# Patient Record
Sex: Female | Born: 1950
Health system: Southern US, Community
[De-identification: ages and names within clinical notes are randomized; demographics above are authoritative.]

## PROBLEM LIST (undated history)

## (undated) DIAGNOSIS — F32A Depression, unspecified: Secondary | ICD-10-CM

## (undated) DIAGNOSIS — E559 Vitamin D deficiency, unspecified: Secondary | ICD-10-CM

## (undated) DIAGNOSIS — C541 Malignant neoplasm of endometrium: Secondary | ICD-10-CM

## (undated) DIAGNOSIS — K219 Gastro-esophageal reflux disease without esophagitis: Secondary | ICD-10-CM

## (undated) DIAGNOSIS — M1990 Unspecified osteoarthritis, unspecified site: Secondary | ICD-10-CM

## (undated) DIAGNOSIS — E079 Disorder of thyroid, unspecified: Secondary | ICD-10-CM

## (undated) DIAGNOSIS — F419 Anxiety disorder, unspecified: Secondary | ICD-10-CM

## (undated) DIAGNOSIS — M810 Age-related osteoporosis without current pathological fracture: Secondary | ICD-10-CM

## (undated) DIAGNOSIS — F329 Major depressive disorder, single episode, unspecified: Secondary | ICD-10-CM

## (undated) DIAGNOSIS — D649 Anemia, unspecified: Secondary | ICD-10-CM

## (undated) DIAGNOSIS — R569 Unspecified convulsions: Secondary | ICD-10-CM

## (undated) DIAGNOSIS — N952 Postmenopausal atrophic vaginitis: Secondary | ICD-10-CM

## (undated) DIAGNOSIS — N809 Endometriosis, unspecified: Secondary | ICD-10-CM

## (undated) DIAGNOSIS — T7840XA Allergy, unspecified, initial encounter: Secondary | ICD-10-CM

## (undated) DIAGNOSIS — E7800 Pure hypercholesterolemia, unspecified: Secondary | ICD-10-CM

## (undated) HISTORY — DX: Unspecified convulsions: R56.9

## (undated) HISTORY — DX: Malignant neoplasm of endometrium: C54.1

## (undated) HISTORY — DX: Disorder of thyroid, unspecified: E07.9

## (undated) HISTORY — DX: Major depressive disorder, single episode, unspecified: F32.9

## (undated) HISTORY — DX: Gastro-esophageal reflux disease without esophagitis: K21.9

## (undated) HISTORY — DX: Depression, unspecified: F32.A

## (undated) HISTORY — DX: Unspecified osteoarthritis, unspecified site: M19.90

## (undated) HISTORY — PX: OTHER SURGICAL HISTORY: SHX169

## (undated) HISTORY — DX: Age-related osteoporosis without current pathological fracture: M81.0

## (undated) HISTORY — DX: Postmenopausal atrophic vaginitis: N95.2

## (undated) HISTORY — DX: Anxiety disorder, unspecified: F41.9

## (undated) HISTORY — PX: OOPHORECTOMY: SHX86

## (undated) HISTORY — PX: BUNIONECTOMY: SHX129

## (undated) HISTORY — DX: Vitamin D deficiency, unspecified: E55.9

## (undated) HISTORY — PX: PELVIC LAPAROSCOPY: SHX162

## (undated) HISTORY — DX: Pure hypercholesterolemia, unspecified: E78.00

## (undated) HISTORY — PX: COLONOSCOPY: SHX174

## (undated) HISTORY — DX: Endometriosis, unspecified: N80.9

## (undated) HISTORY — PX: TONSILLECTOMY: SUR1361

## (undated) HISTORY — DX: Anemia, unspecified: D64.9

## (undated) HISTORY — DX: Allergy, unspecified, initial encounter: T78.40XA

---

## 1997-05-04 ENCOUNTER — Encounter: Admission: RE | Admit: 1997-05-04 | Discharge: 1997-08-02 | Payer: Self-pay | Admitting: Internal Medicine

## 1998-03-02 ENCOUNTER — Other Ambulatory Visit: Admission: RE | Admit: 1998-03-02 | Discharge: 1998-03-02 | Payer: Self-pay | Admitting: Obstetrics and Gynecology

## 1998-07-30 ENCOUNTER — Encounter: Payer: Self-pay | Admitting: Urology

## 1998-07-30 ENCOUNTER — Ambulatory Visit (HOSPITAL_COMMUNITY): Admission: RE | Admit: 1998-07-30 | Discharge: 1998-07-30 | Payer: Self-pay | Admitting: Urology

## 1998-08-08 ENCOUNTER — Ambulatory Visit (HOSPITAL_COMMUNITY): Admission: RE | Admit: 1998-08-08 | Discharge: 1998-08-08 | Payer: Self-pay | Admitting: Urology

## 1998-08-08 ENCOUNTER — Encounter: Payer: Self-pay | Admitting: Urology

## 1999-02-07 ENCOUNTER — Encounter: Admission: RE | Admit: 1999-02-07 | Discharge: 1999-02-07 | Payer: Self-pay | Admitting: Obstetrics and Gynecology

## 1999-02-07 ENCOUNTER — Encounter: Payer: Self-pay | Admitting: Obstetrics and Gynecology

## 1999-02-14 ENCOUNTER — Encounter: Payer: Self-pay | Admitting: Obstetrics and Gynecology

## 1999-02-14 ENCOUNTER — Encounter: Admission: RE | Admit: 1999-02-14 | Discharge: 1999-02-14 | Payer: Self-pay | Admitting: Obstetrics and Gynecology

## 2000-02-27 ENCOUNTER — Encounter: Admission: RE | Admit: 2000-02-27 | Discharge: 2000-02-27 | Payer: Self-pay | Admitting: Obstetrics and Gynecology

## 2000-02-27 ENCOUNTER — Encounter: Payer: Self-pay | Admitting: Obstetrics and Gynecology

## 2000-08-31 ENCOUNTER — Other Ambulatory Visit: Admission: RE | Admit: 2000-08-31 | Discharge: 2000-08-31 | Payer: Self-pay | Admitting: Obstetrics and Gynecology

## 2001-05-07 ENCOUNTER — Encounter: Admission: RE | Admit: 2001-05-07 | Discharge: 2001-05-07 | Payer: Self-pay | Admitting: Obstetrics and Gynecology

## 2001-05-07 ENCOUNTER — Encounter: Payer: Self-pay | Admitting: Obstetrics and Gynecology

## 2001-08-31 ENCOUNTER — Other Ambulatory Visit: Admission: RE | Admit: 2001-08-31 | Discharge: 2001-08-31 | Payer: Self-pay | Admitting: Obstetrics and Gynecology

## 2002-01-28 ENCOUNTER — Ambulatory Visit (HOSPITAL_COMMUNITY): Admission: RE | Admit: 2002-01-28 | Discharge: 2002-01-28 | Payer: Self-pay | Admitting: Gastroenterology

## 2002-08-04 ENCOUNTER — Encounter: Admission: RE | Admit: 2002-08-04 | Discharge: 2002-08-04 | Payer: Self-pay | Admitting: Obstetrics and Gynecology

## 2002-08-04 ENCOUNTER — Encounter: Payer: Self-pay | Admitting: Obstetrics and Gynecology

## 2002-10-04 ENCOUNTER — Other Ambulatory Visit: Admission: RE | Admit: 2002-10-04 | Discharge: 2002-10-04 | Payer: Self-pay | Admitting: Obstetrics and Gynecology

## 2003-02-04 HISTORY — PX: ABDOMINAL HYSTERECTOMY: SHX81

## 2003-10-11 ENCOUNTER — Encounter: Admission: RE | Admit: 2003-10-11 | Discharge: 2003-10-11 | Payer: Self-pay | Admitting: Obstetrics and Gynecology

## 2003-10-11 ENCOUNTER — Other Ambulatory Visit: Admission: RE | Admit: 2003-10-11 | Discharge: 2003-10-11 | Payer: Self-pay | Admitting: Obstetrics and Gynecology

## 2003-11-06 ENCOUNTER — Encounter (INDEPENDENT_AMBULATORY_CARE_PROVIDER_SITE_OTHER): Payer: Self-pay | Admitting: Specialist

## 2003-11-06 ENCOUNTER — Encounter (INDEPENDENT_AMBULATORY_CARE_PROVIDER_SITE_OTHER): Payer: Self-pay | Admitting: *Deleted

## 2003-11-06 ENCOUNTER — Inpatient Hospital Stay (HOSPITAL_COMMUNITY): Admission: RE | Admit: 2003-11-06 | Discharge: 2003-11-09 | Payer: Self-pay | Admitting: Obstetrics and Gynecology

## 2004-04-01 ENCOUNTER — Other Ambulatory Visit: Admission: RE | Admit: 2004-04-01 | Discharge: 2004-04-01 | Payer: Self-pay | Admitting: Obstetrics and Gynecology

## 2004-05-27 ENCOUNTER — Other Ambulatory Visit: Admission: RE | Admit: 2004-05-27 | Discharge: 2004-05-27 | Payer: Self-pay | Admitting: Addiction Medicine

## 2004-10-11 ENCOUNTER — Other Ambulatory Visit: Admission: RE | Admit: 2004-10-11 | Discharge: 2004-10-11 | Payer: Self-pay | Admitting: Obstetrics and Gynecology

## 2005-07-02 ENCOUNTER — Other Ambulatory Visit: Admission: RE | Admit: 2005-07-02 | Discharge: 2005-07-02 | Payer: Self-pay | Admitting: Obstetrics and Gynecology

## 2006-03-10 ENCOUNTER — Encounter: Admission: RE | Admit: 2006-03-10 | Discharge: 2006-03-10 | Payer: Self-pay | Admitting: Obstetrics and Gynecology

## 2006-03-10 ENCOUNTER — Other Ambulatory Visit: Admission: RE | Admit: 2006-03-10 | Discharge: 2006-03-10 | Payer: Self-pay | Admitting: Obstetrics and Gynecology

## 2006-09-15 ENCOUNTER — Other Ambulatory Visit: Admission: RE | Admit: 2006-09-15 | Discharge: 2006-09-15 | Payer: Self-pay | Admitting: Obstetrics and Gynecology

## 2008-03-02 ENCOUNTER — Ambulatory Visit: Payer: Self-pay | Admitting: Obstetrics and Gynecology

## 2008-03-02 ENCOUNTER — Encounter: Payer: Self-pay | Admitting: Obstetrics and Gynecology

## 2008-03-02 ENCOUNTER — Other Ambulatory Visit: Admission: RE | Admit: 2008-03-02 | Discharge: 2008-03-02 | Payer: Self-pay | Admitting: Obstetrics and Gynecology

## 2008-03-02 ENCOUNTER — Encounter: Admission: RE | Admit: 2008-03-02 | Discharge: 2008-03-02 | Payer: Self-pay | Admitting: Obstetrics and Gynecology

## 2008-04-20 ENCOUNTER — Encounter: Admission: RE | Admit: 2008-04-20 | Discharge: 2008-04-20 | Payer: Self-pay | Admitting: Obstetrics and Gynecology

## 2009-03-26 ENCOUNTER — Encounter: Admission: RE | Admit: 2009-03-26 | Discharge: 2009-03-26 | Payer: Self-pay | Admitting: Obstetrics and Gynecology

## 2009-04-09 ENCOUNTER — Other Ambulatory Visit: Admission: RE | Admit: 2009-04-09 | Discharge: 2009-04-09 | Payer: Self-pay | Admitting: Obstetrics and Gynecology

## 2009-04-09 ENCOUNTER — Ambulatory Visit: Payer: Self-pay | Admitting: Obstetrics and Gynecology

## 2010-01-21 ENCOUNTER — Ambulatory Visit: Payer: Self-pay | Admitting: Obstetrics and Gynecology

## 2010-02-24 ENCOUNTER — Encounter: Payer: Self-pay | Admitting: Obstetrics and Gynecology

## 2010-03-18 ENCOUNTER — Other Ambulatory Visit: Payer: Self-pay | Admitting: Obstetrics and Gynecology

## 2010-03-18 DIAGNOSIS — Z1231 Encounter for screening mammogram for malignant neoplasm of breast: Secondary | ICD-10-CM

## 2010-04-11 ENCOUNTER — Encounter: Payer: Self-pay | Admitting: Obstetrics and Gynecology

## 2010-04-11 ENCOUNTER — Ambulatory Visit: Payer: Self-pay

## 2010-05-01 ENCOUNTER — Other Ambulatory Visit: Payer: Self-pay | Admitting: Obstetrics and Gynecology

## 2010-05-01 ENCOUNTER — Other Ambulatory Visit (HOSPITAL_COMMUNITY)
Admission: RE | Admit: 2010-05-01 | Discharge: 2010-05-01 | Disposition: A | Discharge status: Home / Self Care | Payer: 59 | Source: Ambulatory Visit | Attending: Obstetrics and Gynecology | Admitting: Obstetrics and Gynecology

## 2010-05-01 ENCOUNTER — Ambulatory Visit
Admission: RE | Admit: 2010-05-01 | Discharge: 2010-05-01 | Disposition: A | Discharge status: Home / Self Care | Payer: 59 | Source: Ambulatory Visit | Attending: Obstetrics and Gynecology | Admitting: Obstetrics and Gynecology

## 2010-05-01 ENCOUNTER — Encounter (INDEPENDENT_AMBULATORY_CARE_PROVIDER_SITE_OTHER): Payer: 59 | Admitting: Obstetrics and Gynecology

## 2010-05-01 DIAGNOSIS — Z01419 Encounter for gynecological examination (general) (routine) without abnormal findings: Secondary | ICD-10-CM

## 2010-05-01 DIAGNOSIS — Z1231 Encounter for screening mammogram for malignant neoplasm of breast: Secondary | ICD-10-CM

## 2010-05-01 DIAGNOSIS — Z124 Encounter for screening for malignant neoplasm of cervix: Secondary | ICD-10-CM | POA: Insufficient documentation

## 2010-05-15 DIAGNOSIS — Z1211 Encounter for screening for malignant neoplasm of colon: Secondary | ICD-10-CM

## 2010-06-21 NOTE — Op Note (Signed)
Janice [name], Janice [name]              ACCOUNT NO.:  [id number]   MEDICAL RECORD NO.:  [id number]          PATIENT TYPE:  INP   LOCATION:  0002                         FACILITY:  [facility]   PHYSICIAN:  Daniel L. Gottsegen, M.D.DATE OF BIRTH:  Jun 24, 1950   DATE OF PROCEDURE:  11/06/2003  DATE OF DISCHARGE:                                 OPERATIVE REPORT   PREOPERATIVE DIAGNOSES:  Complex endometrial hyperplasia with atypia.   POSTOPERATIVE DIAGNOSES:  Complex endometrial hyperplasia with atypia.   OPERATION:  Total abdominal hysterectomy, bilateral salpingo-oophorectomy.   SURGEON:  Daniel L. Eda Paschal, M.D.   FIRST ASSISTANT:  Michelle L. Vincente Poli, M.D.   FINDINGS:  At the time of surgery, the patient had a small uterus, ovaries  and fallopian tubes were normal.  There were some adhesions adhering the  ovaries to the broad ligament posteriorly but other than that everything was  normal in the pelvic peritoneum.  Exploration of the abdomen was normal,  ileocecal junction was identified and the patient had a normal appendix.  Frozen section did not reveal any remaining tumor and therefore there was no  specific microscopic frozen section.   DESCRIPTION OF PROCEDURE:  After adequate general endotracheal anesthesia,  the patient was placed in the supine position, prepped and draped in the  usual sterile manner. A Foley catheter was inserted in the patient's  bladder, a midline vertical incision was made, the fascia was opened  vertically as was the peritoneum. Subcutaneous bleeders were clamped and  Bovied as encountered. When the peritoneal cavity was opened, the above  findings were noted, peritoneal washings were obtained. The round ligaments  were then bovied and cut and the vesicouterine fold to peritoneum were  sharply incised. The retroperitoneal space was opened, the ureters were  identified, the IP ligaments were clamped, cut and doubly suture ligated.  The uterine arteries were  clamped, cut and doubly suture ligated after the  vesicouterine fold to peritoneum had been sharply dissected free all the way  to below the cervix. The parametrium was down in successive bites by  clamping, cutting and suture ligating. The cervicovaginal junction was  identified, entered with sharp dissection. The uterus, ovaries and tubes  were sent for frozen section. Suture material for the above mentioned  pedicles was #1 chromic catgut. Angled sutures were then placed in the  angles of the vaginal with #1 chromic catgut incorporating cardinal  ligaments and uterosacral ligaments for good vault support. The cuff was  closed with figure-of-eights of #0 Vicryl. Two sponge, needle and instrument  counts were correct, there was no bleeding noted. Frozen section came back  showing no remaining tumor therefore it was felt that lymph node sampling  was not appropriate. The peritoneum and the fascia were then closed with a  double strand of #0 PDS. Two were used, one from the top and one from the  bottom and they met in the midline. The skin was  closed with staples. Estimated blood loss for the entire procedure was 100  mL with none replaced. The patient tolerated the procedure well and left the  operating room  in satisfactory condition draining clear urine from her Foley  catheter.      DLG/MEDQ  D:  11/06/2003  T:  11/06/2003  Job:  [id number]

## 2010-06-21 NOTE — Discharge Summary (Signed)
[name], Janice [name]              ACCOUNT NO.:  [id number]   MEDICAL RECORD NO.:  [id number]          PATIENT TYPE:  INP   LOCATION:  0453                         FACILITY:  [facility]   PHYSICIAN:  Daniel L. Gottsegen, M.D.DATE OF BIRTH:  1950-04-29   DATE OF ADMISSION:  11/06/2003  DATE OF DISCHARGE:  11/09/2003                                 DISCHARGE SUMMARY   HOSPITAL COURSE:  The patient is a 60 year old female who was admitted to  the hospital after endometrial biopsy revealed atypical complex hyperplasia  with carcinoma in situ of the endometrium not ruled out.  On the day of  admission, she was taken to the operating room and a total abdominal  hysterectomy and bilateral salpingo-oophorectomy were performed.  At the  time of the surgery, there was no evidence of metastasis.  The uterus was  sent for frozen section; there was no gross tumor identified; there was  obviously no evidence of myometrial invasion and therefore no frozen was  done, and no lymph node sampling was done.  Postoperatively, she did well.  She did have a slight ileus that responded to IV Dulcolax suppository and  finally by the third postoperative day, she was voiding well, passing gas  and was ready for discharge.   DISCHARGE MEDICATIONS:  Vicodin.   DISCHARGE DIET:  Regular.   DISCHARGE ACTIVITY:  Ambulatory.   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  Myometrium showed leiomyomas.  Ovaries and fallopian tubes  showed focal endometriosis, but negative for carcinoma.  Peritoneal washings  were negative.   CONDITION ON DISCHARGE:  Improved.   WOUND CARE:  The patient will return to the office in 4 days for staple  removal.   DISCHARGE DIAGNOSES:  1.  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  2.  Leiomyoma.  3.   Endometriosis.   OPERATIONS:  1.  Total abdominal hysterectomy.  2.  Bilateral salpingo-oophorectomy.  3.  Peritoneal washings.     Dani   DLG/MEDQ  D:  11/17/2003  T:  11/18/2003  Job:  [id number]

## 2010-06-21 NOTE — H&P (Signed)
Janice [name], Janice [name]              ACCOUNT NO.:  [id number]   MEDICAL RECORD NO.:  [id number]          PATIENT TYPE:  INP   LOCATION:  NA                           FACILITY:  [facility]   PHYSICIAN:  Daniel L. Gottsegen, M.D.DATE OF BIRTH:  06-Dec-1950   DATE OF ADMISSION:  DATE OF DISCHARGE:                                HISTORY & PHYSICAL   CHIEF COMPLAINT:  Complex atypical endometrial hyperplasia.   HISTORY OF PRESENT ILLNESS:  The patient is a 60 year old gravida 1, para 0,  AB 1 who had presented to the office in September with a history of having  periods that were lasting for two to three weeks.  The patient is post  menopausal.  She uses a fem ring and also Prometrium 200 mg day 1 to 12, and  so this being normal monthly withdrawal, this was two to three weeks of  bleeding.  As a result of this, she underwent an endometrial biopsy in the  office.  The endometrial biopsy showed complex endometrial hyperplasia with  atypia.  Dr. Debby Bud who read it felt that it also could be consistent with  early carcinoma of the endometrium, but it was at least complex hyperplasia  with atypia.  As a result of the above, she now enters the hospital for  total abdominal hysterectomy, bilateral salpingo-oophorectomy.  Frozen  section will be done, staging will be done, and lymph node sampling will be  done if appropriate.  Dr. Jonny Ruiz T. Soper, is on standby for the above.   PAST MEDICAL HISTORY:  1.  The patient has had T&A.  2.  In 1993, she had a diagnostic laparoscopy with laser of endometriosis.  3.  In 2005, she had a pelvic mass and underwent laparoscopy with excision      of this mass.  It was a cyst that was benign, and it was not attached to      the uterus or ovaries.  Obviously, she has a history of endometriosis.  4.  Her other history is arthritis.   PRESENT MEDICATIONS:  1.  A fem ring and Prometrium.  2.  She also takes fluoxetine.  3.  Diclofenac for her arthritis.  4.   Wellbutrin.   ALLERGIES:  ERYTHROMYCIN.   FAMILY HISTORY:  Reveals that her paternal grandfather had stomach cancer,  and her father is hypertensive.   SOCIAL HISTORY:  She rarely uses alcohol.  She is not a cigarette smoker.  She does use caffeine.   REVIEW OF SYSTEMS:  HEENT:  Negative.  GENERAL:  Reveals some weight gain.  SKIN:  Reveals some hair loss.  CARDIOVASCULAR:  Negative.  RESPIRATORY:  Negative.  GI:  Negative.  MUSCULOSKELETAL:  Reveals a history of arthritis.  GU:  Negative.  NEUROLOGICAL/ PSYCHIATRIC:  Negative.  ALLERGIC/ IMMULOGIC  AL/ LYMPHATIC AND ENDOCRINE:  Negative except for the allergy to  erythromycin.   PHYSICAL EXAMINATION:  GENERAL:  The patient is a well-developed, well-  nourished female, in no acute distress.  VITAL SIGNS:  Blood pressure 120/78, pulse 80 and regular.  Respirations 16  and unlabored.  She  is afebrile.  HEENT:  Within normal limits.  NECK:  Supple.  Trachea is in the midline.  Thyroid is not enlarged.  LUNGS:  Clear to P&A.  HEART:  No thrills, heaves or murmurs.  BREASTS:  No masses.  ABDOMEN:  Soft without guarding, rebound or masses.  PELVIC:  External and vaginal is normal.  Cervix is clear.  Pap smear shows  no atypia.  Uterus is normal size and shape.  Adnexa are palpably normal.  RECTAL:  Negative.  EXTREMITIES:  Within normal limits.   IMPRESSION:  Atypical complex endometrial hyperplasia.   PLAN:  Total abdominal hysterectomy, bilateral salpingo-oophorectomy.      DLG/MEDQ  D:  11/05/2003  T:  11/05/2003  Job:  [id number]   cc:   [facility]

## 2011-05-08 ENCOUNTER — Other Ambulatory Visit: Payer: Self-pay | Admitting: Obstetrics and Gynecology

## 2011-06-16 ENCOUNTER — Other Ambulatory Visit: Payer: Self-pay | Admitting: Obstetrics and Gynecology

## 2011-06-16 DIAGNOSIS — Z1231 Encounter for screening mammogram for malignant neoplasm of breast: Secondary | ICD-10-CM

## 2011-06-23 ENCOUNTER — Ambulatory Visit
Admission: RE | Admit: 2011-06-23 | Discharge: 2011-06-23 | Disposition: A | Discharge status: Home / Self Care | Payer: 59 | Source: Ambulatory Visit | Attending: Obstetrics and Gynecology | Admitting: Obstetrics and Gynecology

## 2011-06-23 DIAGNOSIS — Z1231 Encounter for screening mammogram for malignant neoplasm of breast: Secondary | ICD-10-CM

## 2012-01-21 ENCOUNTER — Encounter: Payer: Self-pay | Admitting: Gynecology

## 2012-01-21 DIAGNOSIS — M1990 Unspecified osteoarthritis, unspecified site: Secondary | ICD-10-CM | POA: Insufficient documentation

## 2012-01-21 DIAGNOSIS — N809 Endometriosis, unspecified: Secondary | ICD-10-CM | POA: Insufficient documentation

## 2012-01-21 DIAGNOSIS — N952 Postmenopausal atrophic vaginitis: Secondary | ICD-10-CM | POA: Insufficient documentation

## 2012-01-21 DIAGNOSIS — C541 Malignant neoplasm of endometrium: Secondary | ICD-10-CM | POA: Insufficient documentation

## 2012-01-30 ENCOUNTER — Encounter: Payer: Self-pay | Admitting: Obstetrics and Gynecology

## 2012-01-30 ENCOUNTER — Ambulatory Visit (INDEPENDENT_AMBULATORY_CARE_PROVIDER_SITE_OTHER): Payer: 59 | Admitting: Obstetrics and Gynecology

## 2012-01-30 ENCOUNTER — Other Ambulatory Visit (HOSPITAL_COMMUNITY)
Admission: RE | Admit: 2012-01-30 | Discharge: 2012-01-30 | Disposition: A | Discharge status: Home / Self Care | Payer: 59 | Source: Ambulatory Visit | Attending: Obstetrics and Gynecology | Admitting: Obstetrics and Gynecology

## 2012-01-30 VITALS — BP 124/74 | Ht 61.0 in | Wt 129.0 lb

## 2012-01-30 DIAGNOSIS — Z01419 Encounter for gynecological examination (general) (routine) without abnormal findings: Secondary | ICD-10-CM

## 2012-01-30 DIAGNOSIS — E7800 Pure hypercholesterolemia, unspecified: Secondary | ICD-10-CM | POA: Insufficient documentation

## 2012-01-30 NOTE — Addendum Note (Signed)
Addended by: Dayna Barker on: 01/30/2012 10:08 AM   Modules accepted: Orders

## 2012-01-30 NOTE — Patient Instructions (Addendum)
Continue yearly mammograms 

## 2012-01-30 NOTE — Progress Notes (Signed)
Patient came back to see me today for further followup. 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 She has had no recurrences. She is having no vaginal bleeding. She is having no pelvic pain. She is no longer having hot flashes. She is not sexually active. She is aware of vaginal dryness however. In 2008 her bone density showed osteoporosis. We discussed medication. She had previously been on oral byphosponates and did not feel good on them. We discussed IV Reclast but she has declined. She is up-to-date on mammograms. She does her lab through her PCP.  HEENT: Within normal limits.Janice [name] present. Neck: No masses. Supraclavicular lymph nodes: Not enlarged. Breasts: Examined in both sitting and lying position. Symmetrical without skin changes or masses. Abdomen: Soft no masses guarding or rebound. No hernias. Pelvic: External within normal limits. BUS within normal limits. Vaginal examination shows poor  estrogen effect, no cystocele enterocele or rectocele. Cervix and uterus absent. Adnexa within normal limits. Rectovaginal confirmatory. Extremities within normal limits.  Assessment: Endometrioid adenocarcinoma of the endometrium with no existing disease. Atrophic vaginitis. Osteoporosis.  Plan: Pap done. Continue yearly mammograms. Patient once again declined medication for her osteoporosis. Information given on hyalogyn gel

## 2012-01-31 LAB — URINALYSIS W MICROSCOPIC + REFLEX CULTURE
Casts: NONE SEEN
Hgb urine dipstick: NEGATIVE
Nitrite: NEGATIVE
Protein, ur: NEGATIVE mg/dL
Urobilinogen, UA: 0.2 mg/dL (ref 0.0–1.0)

## 2013-12-05 ENCOUNTER — Encounter: Payer: Self-pay | Admitting: Obstetrics and Gynecology

## 2014-05-18 ENCOUNTER — Other Ambulatory Visit (HOSPITAL_COMMUNITY)
Admission: RE | Admit: 2014-05-18 | Discharge: 2014-05-18 | Disposition: A | Discharge status: Home / Self Care | Payer: 59 | Source: Ambulatory Visit | Attending: Gynecology | Admitting: Gynecology

## 2014-05-18 ENCOUNTER — Ambulatory Visit (INDEPENDENT_AMBULATORY_CARE_PROVIDER_SITE_OTHER): Payer: 59 | Admitting: Gynecology

## 2014-05-18 ENCOUNTER — Other Ambulatory Visit: Payer: Self-pay

## 2014-05-18 ENCOUNTER — Encounter: Payer: Self-pay | Admitting: Gynecology

## 2014-05-18 ENCOUNTER — Ambulatory Visit
Admission: RE | Admit: 2014-05-18 | Discharge: 2014-05-18 | Disposition: A | Discharge status: Home / Self Care | Payer: 59 | Source: Ambulatory Visit

## 2014-05-18 ENCOUNTER — Telehealth: Payer: Self-pay | Admitting: *Deleted

## 2014-05-18 VITALS — BP 128/80 | Ht 60.0 in | Wt 138.0 lb

## 2014-05-18 DIAGNOSIS — M810 Age-related osteoporosis without current pathological fracture: Secondary | ICD-10-CM | POA: Insufficient documentation

## 2014-05-18 DIAGNOSIS — K921 Melena: Secondary | ICD-10-CM | POA: Diagnosis not present

## 2014-05-18 DIAGNOSIS — Z1151 Encounter for screening for human papillomavirus (HPV): Secondary | ICD-10-CM | POA: Diagnosis present

## 2014-05-18 DIAGNOSIS — Z01419 Encounter for gynecological examination (general) (routine) without abnormal findings: Secondary | ICD-10-CM | POA: Diagnosis not present

## 2014-05-18 DIAGNOSIS — Z8542 Personal history of malignant neoplasm of other parts of uterus: Secondary | ICD-10-CM

## 2014-05-18 DIAGNOSIS — Z1231 Encounter for screening mammogram for malignant neoplasm of breast: Secondary | ICD-10-CM

## 2014-05-18 NOTE — Progress Notes (Signed)
Janice [name] 1950-12-11 [id number]   History:    64 y.o.  for annual gyn exam who has not been seen in the office since 2013. Patient was last seen by my partner Dr. Cherylann Banas. 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 She has had no recurrences. She is having no vaginal bleeding. She is having no pelvic pain. She is no longer having hot flashes. She is not sexually active.   In 2008 her bone density showed osteoporosis. We discussed medication. She had previously been on oral byphosponates and did not feel good on them. We discussed IV Reclast but she has declined. Her PCP in [place], [place] has been treating her for hypothyroidism and hypercholesterolemia and has been doing her blood work. Patient complaining today for the past 2-3 days and no seen blood in her stools. She has not had a colonoscopy in over 10 years. Her last bone density study as described above was in 2008 and her last Pap smear was in 2013. Her mammogram was also in 2013.   Past medical history,surgical history, family history and social history were all reviewed and documented in the EPIC chart.  Gynecologic History No LMP recorded. Patient has had a hysterectomy. Contraception: status post hysterectomy Last Pap: 2013. Results were: normal Last mammogram: 2013. Results were: normal  Obstetric History OB History  Gravida Para Term Preterm AB SAB TAB Ectopic Multiple Living  1    1     0    # Outcome Date GA Lbr Len/2nd Weight Sex Delivery Anes PTL Lv  1 AB                ROS: A ROS was performed and pertinent positives and negatives are included in the history.  GENERAL: No fevers or chills. HEENT: No change in vision, no earache, sore throat or sinus congestion. NECK: No pain or stiffness. CARDIOVASCULAR: No chest pain or pressure. No palpitations.  PULMONARY: No shortness of breath, cough or wheeze. GASTROINTESTINAL: No abdominal pain, nausea, vomiting or diarrhea, melena or bright red blood per rectum. GENITOURINARY: No urinary frequency, urgency, hesitancy or dysuria. MUSCULOSKELETAL: No joint or muscle pain, no back pain, no recent trauma. DERMATOLOGIC: No rash, no itching, no lesions. ENDOCRINE: No polyuria, polydipsia, no heat or cold intolerance. No recent change in weight. HEMATOLOGICAL: No anemia or easy bruising or bleeding. NEUROLOGIC: No headache, seizures, numbness, tingling or weakness. PSYCHIATRIC: No depression, no loss of interest in normal activity or change in sleep pattern.     Exam: chaperone present  BP 128/80 mmHg  Ht 5' (1.524 m)  Wt 138 lb (62.596 kg)  BMI 26.95 kg/m2  Body mass index is 26.95 kg/(m^2).  General appearance : Well developed well nourished female. No acute distress HEENT: Eyes: no retinal hemorrhage or exudates,  Neck supple, trachea midline, no carotid bruits, no thyroidmegaly Lungs: Clear to auscultation, no rhonchi or wheezes, or rib retractions  Heart: Regular rate and rhythm, no murmurs or gallops Breast:Examined in sitting and supine position were symmetrical in appearance, no palpable masses or tenderness,  no skin retraction, no nipple inversion, no nipple discharge, no skin discoloration, no axillary or supraclavicular lymphadenopathy Abdomen: no palpable masses or tenderness, no rebound or guarding Extremities: no edema or skin discoloration or tenderness  Pelvic:  Bartholin, Urethra, Skene Glands: Within normal limits  Vagina: No gross lesions or discharge  Cervix: Absent  Uterus  absent  Adnexa  Without masses or tenderness  Anus and perineum  normal   Rectovaginal  normal sphincter tone without palpated masses or tenderness             Hemoccult negative     Assessment/Plan:  64 y.o. female for annual exam who has not had her gynecological exam since 2013. Patient  with past history of total abdominal hysterectomy with bilateral salpingo-oophorectomy for grade 1 stage I well-differentiated endometrioid adenocarcinoma of the uterus with no evidence of recurrent disease and has done well. Patient past few days complaining of rectal bleeding. Fecal Hemoccult testing today the office negative. Patient will be referred to the gastroenterologist for colonoscopy since is overdue as well. Because of her history of osteoporosis were going to check a calcium, vitamin D and PTH level. She is to schedule a bone density study since it is overdue. We'll discuss the results when available. We discussed importance of calcium vitamin D and regular exercise for osteoporosis prevention. She was also provided the requisition to obtain her overdue mammogram. A Pap smear was done today.   Terrance Mass MD, 10:10 AM 05/18/2014

## 2014-05-18 NOTE — Telephone Encounter (Signed)
Referral placed for the below they will contact pt to schedule.

## 2014-05-18 NOTE — Telephone Encounter (Signed)
-----   Message from Terrance Mass, MD sent at 05/18/2014  9:54 AM EDT ----- Please schedule this former Janice [name] patient for colonoscopy. She is 60 with hematochezia. Thanks

## 2014-05-18 NOTE — Addendum Note (Signed)
Addended by: Burnett Kanaris on: 05/18/2014 10:35 AM   Modules accepted: Orders

## 2014-05-18 NOTE — Patient Instructions (Signed)
Osteoporosis Throughout your life, your body breaks down old bone and replaces it with new bone. As you get older, your body does not replace bone as quickly as it breaks it down. By the age of 30 years, most people begin to gradually lose bone because of the imbalance between bone loss and replacement. Some people lose more bone than others. Bone loss beyond a specified normal degree is considered osteoporosis.  Osteoporosis affects the strength and durability of your bones. The inside of the ends of your bones and your flat bones, like the bones of your pelvis, look like honeycomb, filled with tiny open spaces. As bone loss occurs, your bones become less dense. This means that the open spaces inside your bones become bigger and the walls between these spaces become thinner. This makes your bones weaker. Bones of a person with osteoporosis can become so weak that they can break (fracture) during minor accidents, such as a simple fall. CAUSES  The following factors have been associated with the development of osteoporosis:  Smoking.  Drinking more than 2 alcoholic drinks several days per week.  Long-term use of certain medicines:  Corticosteroids.  Chemotherapy medicines.  Thyroid medicines.  Antiepileptic medicines.  Gonadal hormone suppression medicine.  Immunosuppression medicine.  Being underweight.  Lack of physical activity.  Lack of exposure to the sun. This can lead to vitamin D deficiency.  Certain medical conditions:  Certain inflammatory bowel diseases, such as Crohn disease and ulcerative colitis.  Diabetes.  Hyperthyroidism.  Hyperparathyroidism. RISK FACTORS Anyone can develop osteoporosis. However, the following factors can increase your risk of developing osteoporosis:  Gender--Women are at higher risk than men.  Age--Being older than 50 years increases your risk.  Ethnicity--White and Asian people have an increased risk.  Weight --Being extremely  underweight can increase your risk of osteoporosis.  Family history of osteoporosis--Having a family member who has developed osteoporosis can increase your risk. SYMPTOMS  Usually, people with osteoporosis have no symptoms.  DIAGNOSIS  Signs during a physical exam that may prompt your caregiver to suspect osteoporosis include:  Decreased height. This is usually caused by the compression of the bones that form your spine (vertebrae) because they have weakened and become fractured.  A curving or rounding of the upper back (kyphosis). To confirm signs of osteoporosis, your caregiver may request a procedure that uses 2 low-dose X-ray beams with different levels of energy to measure your bone mineral density (dual-energy X-ray absorptiometry [DXA]). Also, your caregiver may check your level of vitamin D. TREATMENT  The goal of osteoporosis treatment is to strengthen bones in order to decrease the risk of bone fractures. There are different types of medicines available to help achieve this goal. Some of these medicines work by slowing the processes of bone loss. Some medicines work by increasing bone density. Treatment also involves making sure that your levels of calcium and vitamin D are adequate. PREVENTION  There are things you can do to help prevent osteoporosis. Adequate intake of calcium and vitamin D can help you achieve optimal bone mineral density. Regular exercise can also help, especially resistance and weight-bearing activities. If you smoke, quitting smoking is an important part of osteoporosis prevention. MAKE SURE YOU:  Understand these instructions.  Will watch your condition.  Will get help right away if you are not doing well or get worse. FOR MORE INFORMATION www.osteo.org and www.nof.org Document Released: 10/30/2004 Document Revised: 05/17/2012 Document Reviewed: 01/04/2011 [employer] Patient Information 2015 [employer]. This information is not   intended to replace advice  given to you by your health care provider. Make sure you discuss any questions you have with your health care provider. Bone Densitometry Bone densitometry is a special X-ray that measures your bone density and can be used to help predict your risk of bone fractures. This test is used to determine bone mineral content and density to diagnose osteoporosis. Osteoporosis is the loss of bone that may cause the bone to become weak. Osteoporosis commonly occurs in women entering menopause. However, it may be found in men and in people with other diseases. PREPARATION FOR TEST No preparation necessary. WHO SHOULD BE TESTED?  All women older than 20.  Postmenopausal women (50 to 74) with risk factors for osteoporosis.  People with a previous fracture caused by normal activities.  People with a small body frame (less than 127 poundsor a body mass index [BMI] of less than 21).  People who have a parent with a hip fracture or history of osteoporosis.  People who smoke.  People who have rheumatoid arthritis.  Anyone who engages in excessive alcohol use (more than 3 drinks most days).  Women who experience early menopause. WHEN SHOULD YOU BE RETESTED? Current guidelines suggest that you should wait at least 2 years before doing a bone density test again if your first test was normal.Recent studies indicated that women with normal bone density may be able to wait a few years before needing to repeat a bone density test. You should discuss this with your caregiver.  NORMAL FINDINGS   Normal: less than standard deviation below normal (greater than -1).  Osteopenia: 1 to 2.5 standard deviations below normal (-1 to -2.5).  Osteoporosis: greater than 2.5 standard deviations below normal (less than -2.5). Test results are reported as a "T score" and a "Z score."The T score is a number that compares your bone density with the bone density of healthy, young women.The Z score is a number that compares  your bone density with the scores of women who are the same age, gender, and race.  Ranges for normal findings may vary among different laboratories and hospitals. You should always check with your doctor after having lab work or other tests done to discuss the meaning of your test results and whether your values are considered within normal limits. MEANING OF TEST  Your caregiver will go over the test results with you and discuss the importance and meaning of your results, as well as treatment options and the need for additional tests if necessary. OBTAINING THE TEST RESULTS It is your responsibility to obtain your test results. Ask the lab or department performing the test when and how you will get your results. Document Released: 02/12/2004 Document Revised: 04/14/2011 Document Reviewed: 03/06/2010 [facility] Patient Information 2015 [place], [place]. This information is not intended to replace advice given to you by your health care provider. Make sure you discuss any questions you have with your health care provider.

## 2014-05-19 ENCOUNTER — Encounter: Payer: Self-pay | Admitting: Gynecology

## 2014-05-19 LAB — CYTOLOGY - PAP

## 2014-05-19 LAB — PTH, INTACT AND CALCIUM
Calcium: 9.7 mg/dL (ref 8.4–10.5)
PTH: 32 pg/mL (ref 14–64)

## 2014-05-19 LAB — VITAMIN D 25 HYDROXY (VIT D DEFICIENCY, FRACTURES): Vit D, 25-Hydroxy: 24 ng/mL — ABNORMAL LOW (ref 30–100)

## 2014-05-25 ENCOUNTER — Other Ambulatory Visit: Payer: Self-pay | Admitting: Women's Health

## 2014-05-26 ENCOUNTER — Other Ambulatory Visit: Payer: Self-pay | Admitting: Gynecology

## 2014-05-26 DIAGNOSIS — E559 Vitamin D deficiency, unspecified: Secondary | ICD-10-CM

## 2014-05-26 MED ORDER — VITAMIN D (ERGOCALCIFEROL) 1.25 MG (50000 UNIT) PO CAPS: 50000.0000 [IU] | ORAL_CAPSULE | ORAL | Status: DC

## 2014-06-06 NOTE — Telephone Encounter (Signed)
[facility] called pt x1 and I called pt as well to please call them to schedule the below.

## 2014-06-12 ENCOUNTER — Encounter: Payer: Self-pay | Admitting: Internal Medicine

## 2014-06-12 NOTE — Telephone Encounter (Signed)
Appointment 08/24/14 @ 8:45am

## 2014-06-15 ENCOUNTER — Ambulatory Visit (INDEPENDENT_AMBULATORY_CARE_PROVIDER_SITE_OTHER): Payer: 59

## 2014-06-15 DIAGNOSIS — M810 Age-related osteoporosis without current pathological fracture: Secondary | ICD-10-CM | POA: Diagnosis not present

## 2014-08-24 ENCOUNTER — Other Ambulatory Visit: Payer: 59

## 2014-08-24 ENCOUNTER — Encounter: Payer: Self-pay | Admitting: Internal Medicine

## 2014-08-24 ENCOUNTER — Ambulatory Visit (INDEPENDENT_AMBULATORY_CARE_PROVIDER_SITE_OTHER): Payer: 59 | Admitting: Internal Medicine

## 2014-08-24 VITALS — BP 102/70 | HR 72 | Ht 61.5 in | Wt 136.4 lb

## 2014-08-24 DIAGNOSIS — Z1211 Encounter for screening for malignant neoplasm of colon: Secondary | ICD-10-CM

## 2014-08-24 DIAGNOSIS — E559 Vitamin D deficiency, unspecified: Secondary | ICD-10-CM

## 2014-08-24 DIAGNOSIS — K648 Other hemorrhoids: Secondary | ICD-10-CM

## 2014-08-24 DIAGNOSIS — K644 Residual hemorrhoidal skin tags: Secondary | ICD-10-CM

## 2014-08-24 NOTE — Progress Notes (Signed)
Referred by Uvaldo Rising, MD Subjective:    Patient ID: Janice [name], female    DOB: [birth year], 64 y.o.   MRN: [id number] Cc: Rectal bleeding HPI The patient reports painless bright red rectal bleeding a few times 3-4 months ago and none since. No abdominal pain, bowel habit changes. No melena. Nothing protruding from the anus. He has not had this problem before. It has not recurred in a few months. He did not try any over-the-counter treatment. Has brown stool with the bleeding - bright red into toilet. Heme negative at Dr. Toney Rakes visit. All other GI ROS negative does report having a colonoscopy 20 or 30 years ago for some bleeding that she had then but does not remember the results and we do not have those records. Allergies  Allergen Reactions  . Erythromycin Nausea And Vomiting  . Sulfa Antibiotics Nausea And Vomiting   Outpatient Prescriptions Prior to Visit  Medication Sig Dispense Refill  . buPROPion (WELLBUTRIN XL) 300 MG 24 hr tablet Take 300 mg by mouth daily.    Marland Kitchen desvenlafaxine (PRISTIQ) 50 MG 24 hr tablet Take 50 mg by mouth daily.    Marland Kitchen levothyroxine (SYNTHROID, LEVOTHROID) 88 MCG tablet Take 88 mcg by mouth daily.  4  . nabumetone (RELAFEN) 750 MG tablet as needed.  6  . pravastatin (PRAVACHOL) 20 MG tablet Take 20 mg by mouth daily.  3  . traZODone (DESYREL) 50 MG tablet as needed.  2  . TRAZODONE HCL PO Take by mouth as directed.     Marland Kitchen QUEtiapine (SEROQUEL) 25 MG tablet Take 25 mg by mouth at bedtime.    . Vitamin D, Ergocalciferol, (DRISDOL) 50000 UNITS CAPS capsule Take 1 capsule (50,000 Units total) by mouth every 7 (seven) days. 12 capsule 0   No facility-administered medications prior to visit.   Past Medical History  Diagnosis Date  . Endometriosis   . Osteoporosis   . Endometrial cancer     Well diff. Grade I StageI  . Atrophic vaginitis   . Arthritis   . Elevated cholesterol   . Vitamin D deficiency    Past Surgical History  Procedure Laterality  Date  . Tonsillectomy    . Pelvic laparoscopy      DL-Laser endometriosis  . Abdominal cyst removed    . Abdominal hysterectomy  2005    TAH BSO  . Bunionectomy    . Oophorectomy      BSO  . Colonoscopy     History   Social History  . Marital Status: Divorced    Spouse Name: N/A  . Number of Children: N/A  . Years of Education: N/A   Social History Main Topics  . Smoking status: Never Smoker   . Smokeless tobacco: Not on file  . Alcohol Use: Yes     Comment: Rare  . Drug Use: No  . Sexual Activity: No   Other Topics Concern  . None   Social History Narrative   Divorced no children   Self-employed - personal care giver, cleaning, yard work   Formerly Psychologist, educational work   1-2 caffeine/day   08/24/2014      Family History  Problem Relation Age of Onset  . Hypertension Father   . Heart disease Father   . Hyperlipidemia Father     Review of Systems Allergies, arthritis, fatigue/depression, insomnia All other ROS negative    Objective:   Physical Exam @BP  102/70 mmHg  Pulse 72  Ht 5' 1.5" (1.562 m)  Wt 136 lb 6.4 oz (61.871 kg)  BMI 25.36 kg/m2@  General:  Well-developed, well-nourished and in no acute distress Eyes:  anicteric. ENT:   Mouth and posterior pharynx free of lesions.  Neck:   supple w/o thyromegaly or mass.  Lungs: Clear to auscultation bilaterally. Heart:  S1S2, no rubs, murmurs, gallops. Abdomen:  soft, non-tender, no hepatosplenomegaly, hernia, or mass and BS+.  Rectal:  Christian Mate, CMA present  Small anterior anal tag, NL anoderm DRE is nontender w/ brown stool and no mass, NL resting tone   Lymph:  no cervical or supraclavicular adenopathy. Extremities:   no edema, cyanosis or clubbing Skin   no rash. Neuro:  A&O x 3.  Psych:  appropriate mood and  Affect.  Anoscopy:  Small external hemorrhoids, violaceous in all positions  Data Reviewed: April GYN note       Assessment & Plan:  External hemorrhoids with  bleeding  Colon cancer screening  1) Schedule screening colonoscopy - The risks and benefits as well as alternatives of endoscopic procedure(s) have been discussed and reviewed. All questions answered. The patient agrees to proceed. 2) Hemorrhoid handout - observe for more problems and await colonoscopy results  I appreciate the opportunity to care for this patient. XK:[email or web address],[id number], MD Uvaldo Rising, MD

## 2014-08-24 NOTE — Patient Instructions (Signed)
  You have been scheduled for a colonoscopy. Please follow written instructions given to you at your visit today.  Please pick up your prep supplies at the pharmacy within the next 1-3 days. If you use inhalers (even only as needed), please bring them with you on the day of your procedure.   Today you have been given a hemorrhoid handout to read.    I appreciate the opportunity to care for you. Silvano Rusk, MD, [facility]

## 2014-08-25 LAB — VITAMIN D 25 HYDROXY (VIT D DEFICIENCY, FRACTURES): VIT D 25 HYDROXY: 54 ng/mL (ref 30–100)

## 2014-08-29 ENCOUNTER — Encounter: Payer: Self-pay | Admitting: Internal Medicine

## 2014-08-29 ENCOUNTER — Ambulatory Visit (AMBULATORY_SURGERY_CENTER): Payer: 59 | Admitting: Internal Medicine

## 2014-08-29 VITALS — BP 141/89 | HR 69 | Temp 97.3°F | Resp 12 | Ht 61.5 in | Wt 136.0 lb

## 2014-08-29 DIAGNOSIS — Z1211 Encounter for screening for malignant neoplasm of colon: Secondary | ICD-10-CM | POA: Diagnosis not present

## 2014-08-29 MED ORDER — SODIUM CHLORIDE 0.9 % IV SOLN: 500.0000 mL | INTRAVENOUS | Status: DC

## 2014-08-29 NOTE — Progress Notes (Signed)
Transferred to recovery room. A/O x3, pleased with MAC.  VSS.  Report to April, RN. 

## 2014-08-29 NOTE — Patient Instructions (Addendum)
No polyps or cancer seen! You do have small external hemorrhoids.  Next routine colonoscopy/colon cancer screening test in 10 years - 2026  I appreciate the opportunity to care for you. Gatha Mayer, MD, FACG  YOU HAD AN ENDOSCOPIC PROCEDURE TODAY AT [facility]:   Refer to the procedure report that was given to you for any specific questions about what was found during the examination.  If the procedure report does not answer your questions, please call your gastroenterologist to clarify.  If you requested that your care partner not be given the details of your procedure findings, then the procedure report has been included in a sealed envelope for you to review at your convenience later.  YOU SHOULD EXPECT: Some feelings of bloating in the abdomen. Passage of more gas than usual.  Walking can help get rid of the air that was put into your GI tract during the procedure and reduce the bloating. If you had a lower endoscopy (such as a colonoscopy or flexible sigmoidoscopy) you may notice spotting of blood in your stool or on the toilet paper. If you underwent a bowel prep for your procedure, you may not have a normal bowel movement for a few days.  Please Note:  You might notice some irritation and congestion in your nose or some drainage.  This is from the oxygen used during your procedure.  There is no need for concern and it should clear up in a day or so.  SYMPTOMS TO REPORT IMMEDIATELY:   Following lower endoscopy (colonoscopy or flexible sigmoidoscopy):  Excessive amounts of blood in the stool  Significant tenderness or worsening of abdominal pains  Swelling of the abdomen that is new, acute  Fever of 100F or higher  For urgent or emergent issues, a gastroenterologist can be reached at any hour by calling [phone].   DIET: Your first meal following the procedure should be a small meal and then it is ok to progress to your normal diet. Heavy or fried  foods are harder to digest and may make you feel nauseous or bloated.  Likewise, meals heavy in dairy and vegetables can increase bloating.  Drink plenty of fluids but you should avoid alcoholic beverages for 24 hours.  ACTIVITY:  You should plan to take it easy for the rest of today and you should NOT DRIVE or use heavy machinery until tomorrow (because of the sedation medicines used during the test).    FOLLOW UP: Our staff will call the number listed on your records the next business day following your procedure to check on you and address any questions or concerns that you may have regarding the information given to you following your procedure. If we do not reach you, we will leave a message.  However, if you are feeling well and you are not experiencing any problems, there is no need to return our call.  We will assume that you have returned to your regular daily activities without incident.  If any biopsies were taken you will be contacted by phone or by letter within the next 1-3 weeks.  Please call us at [phone] if you have not heard about the biopsies in 3 weeks.    SIGNATURES/CONFIDENTIALITY: You and/or your care partner have signed paperwork which will be entered into your electronic medical record.  These signatures attest to the fact that that the information above on your After Visit Summary has been reviewed and is understood.  Full responsibility  of the confidentiality of this discharge information lies with you and/or your care-partner.  Hemorrhoids-handout given  Repeat colonoscopy in 10 years 2026.

## 2014-08-29 NOTE — Op Note (Signed)
[place]  [address] [place], [id number]   COLONOSCOPY PROCEDURE REPORT  PATIENT: Janice [name], Janice [name]  MR#: [id number] BIRTHDATE: 09/10/1950 , 63  yrs. old GENDER: female ENDOSCOPIST: Gatha Mayer, MD, [facility] PROCEDURE DATE:  08/29/2014 PROCEDURE:   Colonoscopy, screening First Screening Colonoscopy - Avg.  risk and is 50 yrs.  old or older Yes.  Prior Negative Screening - Now for repeat screening. N/A  History of Adenoma - Now for follow-up colonoscopy & has been > or = to 3 yrs.  N/A  Polyps removed today? No Recommend repeat exam, <10 yrs? No ASA CLASS:   Class II INDICATIONS:Screening for colonic neoplasia and Colorectal Neoplasm Risk Assessment for this procedure is average risk. MEDICATIONS: Propofol 150 mg IV and Monitored anesthesia care  DESCRIPTION OF PROCEDURE:   After the risks benefits and alternatives of the procedure were thoroughly explained, informed consent was obtained.  The digital rectal exam revealed no abnormalities of the rectum.   The LB [id number]  endoscope was introduced through the anus and advanced to the cecum, which was identified by both the appendix and ileocecal valve. No adverse events experienced.   The quality of the prep was excellent. (MiraLax was used)  The instrument was then slowly withdrawn as the colon was fully examined. Estimated blood loss is zero unless otherwise noted in this procedure report.      COLON FINDINGS: Small external hemorrhoids were found.   The examination was otherwise normal.   Right colon retroflexion included.  Retroflexed views revealed no abnormalities. The time to cecum = 3.1 Withdrawal time = 7.6   The scope was withdrawn and the procedure completed. COMPLICATIONS: There were no immediate complications.  ENDOSCOPIC IMPRESSION: 1.   Small external hemorrhoids 2.   The examination was otherwise normal - excellent prep - first screening  RECOMMENDATIONS: Repeat  Colonscopy/CRCA screening test in 10 years.  2026  eSigned:  Gatha Mayer, MD, [facility] 08/29/2014 11:20 AM   cc: The Patient, Dr. Ernestene Kiel and Dr. Uvaldo Rising

## 2014-08-30 ENCOUNTER — Telehealth: Payer: Self-pay | Admitting: *Deleted

## 2014-08-30 NOTE — Telephone Encounter (Signed)
Number identifier, left message, follow-up  

## 2014-09-14 ENCOUNTER — Ambulatory Visit: Payer: 59 | Admitting: Gynecology

## 2014-09-21 ENCOUNTER — Ambulatory Visit: Payer: 59 | Admitting: Gynecology

## 2014-10-05 ENCOUNTER — Ambulatory Visit (INDEPENDENT_AMBULATORY_CARE_PROVIDER_SITE_OTHER): Payer: 59 | Admitting: Gynecology

## 2014-10-05 ENCOUNTER — Encounter: Payer: Self-pay | Admitting: Gynecology

## 2014-10-05 VITALS — BP 132/84

## 2014-10-05 DIAGNOSIS — M810 Age-related osteoporosis without current pathological fracture: Secondary | ICD-10-CM | POA: Diagnosis not present

## 2014-10-05 LAB — CALCIUM: Calcium: 9.5 mg/dL (ref 8.6–10.4)

## 2014-10-05 NOTE — Progress Notes (Signed)
   Patient is a 64 year old who was seen in the office on April 2016. Her previous office visit was back in 2013. She had previously been followed by my partner Dr. Cherylann Banas. Patient had been diagnosed several years ago with osteoporosis back in 2008. She had been on several oral bisphosphonates but could not tolerate them so she discontinued them. On the last office visit we discussed other options to include monoclonal antibody such as Prolia. She wanted to wait to see the result of this bone density study before making a decision. Back in April her vitamin D level was low and she was started on 50,000 units every weekly for 3 months she will return back in her vitamin D level was back to normal at 54 and she's currently taking 5000 units daily which I have asked her to back down to 2000 units daily.  Patient's bone density study indicated the following: The lowest T score was at left femoral neck with a value of -2.9 When bone density study was compared with previous study of 2008 there appeared to be a -10.1% decrease in bone mineralization of the AP spine and -9.4% decrease in bone mineralization of the right femoral neck and no change in the left femoral neck of significance.  With these findings it was share with the patient that her bone mass is 25% below normal and her risk of a hip fracture is 11 times greater than the general population. We discussed that calcium and vitamin D and regular exercise alone will not prevent her from osteoporosis on the scores. We discussed different treatment options and she would like now to proceed with a monoclonal antibody Prolia 60 mg subcutaneous every 6 months. The risk benefits and pros and cons of this medication were discussed with the patient to include spontaneous up trochanteric fractures as well as osteonecrosis of the jaw. We are going to check a calcium level today as well. Literature information was provided on the medication as well as on  osteoporosis.  Greater than 50% of time was spent in counseling coronary care of this patient with osteoporosis

## 2014-10-05 NOTE — Patient Instructions (Signed)
prolia Denosumab injection What is this medicine? DENOSUMAB (den oh sue mab) slows bone breakdown. Prolia is used to treat osteoporosis in women after menopause and in men. Delton See is used to prevent bone fractures and other bone problems caused by cancer bone metastases. Delton See is also used to treat giant cell tumor of the bone. This medicine may be used for other purposes; ask your health care provider or pharmacist if you have questions. COMMON BRAND NAME(S): Prolia, XGEVA What should I tell my health care provider before I take this medicine? They need to know if you have any of these conditions: -dental disease -eczema -infection or history of infections -kidney disease or on dialysis -low blood calcium or vitamin D -malabsorption syndrome -scheduled to have surgery or tooth extraction -taking medicine that contains denosumab -thyroid or parathyroid disease -an unusual reaction to denosumab, other medicines, foods, dyes, or preservatives -pregnant or trying to get pregnant -breast-feeding How should I use this medicine? This medicine is for injection under the skin. It is given by a health care professional in a hospital or clinic setting. If you are getting Prolia, a special MedGuide will be given to you by the pharmacist with each prescription and refill. Be sure to read this information carefully each time. For Prolia, talk to your pediatrician regarding the use of this medicine in children. Special care may be needed. For Delton See, talk to your pediatrician regarding the use of this medicine in children. While this drug may be prescribed for children as young as 13 years for selected conditions, precautions do apply. Overdosage: If you think you've taken too much of this medicine contact a poison control center or emergency room at once. Overdosage: If you think you have taken too much of this medicine contact a poison control center or emergency room at once. NOTE: This medicine is only  for you. Do not share this medicine with others. What if I miss a dose? It is important not to miss your dose. Call your doctor or health care professional if you are unable to keep an appointment. What may interact with this medicine? Do not take this medicine with any of the following medications: -other medicines containing denosumab This medicine may also interact with the following medications: -medicines that suppress the immune system -medicines that treat cancer -steroid medicines like prednisone or cortisone This list may not describe all possible interactions. Give your health care provider a list of all the medicines, herbs, non-prescription drugs, or dietary supplements you use. Also tell them if you smoke, drink alcohol, or use illegal drugs. Some items may interact with your medicine. What should I watch for while using this medicine? Visit your doctor or health care professional for regular checks on your progress. Your doctor or health care professional may order blood tests and other tests to see how you are doing. Call your doctor or health care professional if you get a cold or other infection while receiving this medicine. Do not treat yourself. This medicine may decrease your body's ability to fight infection. You should make sure you get enough calcium and vitamin D while you are taking this medicine, unless your doctor tells you not to. Discuss the foods you eat and the vitamins you take with your health care professional. See your dentist regularly. Brush and floss your teeth as directed. Before you have any dental work done, tell your dentist you are receiving this medicine. Do not become pregnant while taking this medicine or for 5 months after  stopping it. Women should inform their doctor if they wish to become pregnant or think they might be pregnant. There is a potential for serious side effects to an unborn child. Talk to your health care professional or pharmacist for  more information. What side effects may I notice from receiving this medicine? Side effects that you should report to your doctor or health care professional as soon as possible: -allergic reactions like skin rash, itching or hives, swelling of the face, lips, or tongue -breathing problems -chest pain -fast, irregular heartbeat -feeling faint or lightheaded, falls -fever, chills, or any other sign of infection -muscle spasms, tightening, or twitches -numbness or tingling -skin blisters or bumps, or is dry, peels, or red -slow healing or unexplained pain in the mouth or jaw -unusual bleeding or bruising Side effects that usually do not require medical attention (Report these to your doctor or health care professional if they continue or are bothersome.): -muscle pain -stomach upset, gas This list may not describe all possible side effects. Call your doctor for medical advice about side effects. You may report side effects to FDA at 1-800-FDA-1088. Where should I keep my medicine? This medicine is only given in a clinic, doctor's office, or other health care setting and will not be stored at home. NOTE: This sheet is a summary. It may not cover all possible information. If you have questions about this medicine, talk to your doctor, pharmacist, or health care provider.  2015, Elsevier/Gold Standard. (2011-07-21 12:37:47) Osteoporosis Throughout your life, your body breaks down old bone and replaces it with new bone. As you get older, your body does not replace bone as quickly as it breaks it down. By the age of 12 years, most people begin to gradually lose bone because of the imbalance between bone loss and replacement. Some people lose more bone than others. Bone loss beyond a specified normal degree is considered osteoporosis.  Osteoporosis affects the strength and durability of your bones. The inside of the ends of your bones and your flat bones, like the bones of your pelvis, look like  honeycomb, filled with tiny open spaces. As bone loss occurs, your bones become less dense. This means that the open spaces inside your bones become bigger and the walls between these spaces become thinner. This makes your bones weaker. Bones of a person with osteoporosis can become so weak that they can break (fracture) during minor accidents, such as a simple fall. CAUSES  The following factors have been associated with the development of osteoporosis:  Smoking.  Drinking more than 2 alcoholic drinks several days per week.  Long-term use of certain medicines:  Corticosteroids.  Chemotherapy medicines.  Thyroid medicines.  Antiepileptic medicines.  Gonadal hormone suppression medicine.  Immunosuppression medicine.  Being underweight.  Lack of physical activity.  Lack of exposure to the sun. This can lead to vitamin D deficiency.  Certain medical conditions:  Certain inflammatory bowel diseases, such as Crohn disease and ulcerative colitis.  Diabetes.  Hyperthyroidism.  Hyperparathyroidism. RISK FACTORS Anyone can develop osteoporosis. However, the following factors can increase your risk of developing osteoporosis:  Gender--Women are at higher risk than men.  Age--Being older than 50 years increases your risk.  Ethnicity--White and Asian people have an increased risk.  Weight --Being extremely underweight can increase your risk of osteoporosis.  Family history of osteoporosis--Having a family member who has developed osteoporosis can increase your risk. SYMPTOMS  Usually, people with osteoporosis have no symptoms.  DIAGNOSIS  Signs during  a physical exam that may prompt your caregiver to suspect osteoporosis include:  Decreased height. This is usually caused by the compression of the bones that form your spine (vertebrae) because they have weakened and become fractured.  A curving or rounding of the upper back (kyphosis). To confirm signs of osteoporosis,  your caregiver may request a procedure that uses 2 low-dose X-ray beams with different levels of energy to measure your bone mineral density (dual-energy X-ray absorptiometry [DXA]). Also, your caregiver may check your level of vitamin D. TREATMENT  The goal of osteoporosis treatment is to strengthen bones in order to decrease the risk of bone fractures. There are different types of medicines available to help achieve this goal. Some of these medicines work by slowing the processes of bone loss. Some medicines work by increasing bone density. Treatment also involves making sure that your levels of calcium and vitamin D are adequate. PREVENTION  There are things you can do to help prevent osteoporosis. Adequate intake of calcium and vitamin D can help you achieve optimal bone mineral density. Regular exercise can also help, especially resistance and weight-bearing activities. If you smoke, quitting smoking is an important part of osteoporosis prevention. MAKE SURE YOU:  Understand these instructions.  Will watch your condition.  Will get help right away if you are not doing well or get worse. FOR MORE INFORMATION www.osteo.org and EquipmentWeekly.com.ee Document Released: 10/30/2004 Document Revised: 05/17/2012 Document Reviewed: 01/04/2011 [facility] Patient Information 2015 [place], [place]. This information is not intended to replace advice given to you by your health care provider. Make sure you discuss any questions you have with your health care provider.

## 2014-10-11 ENCOUNTER — Telehealth: Payer: Self-pay | Admitting: Gynecology

## 2014-10-11 NOTE — Telephone Encounter (Signed)
Please make arrangements for Prolia for this patient with osteoporosis. Calcium level drawn today.  Note from Dr Toney Rakes. IN the process of checking insurance benefits.  Calcium 9.5  On 10/05/14

## 2014-10-17 NOTE — Telephone Encounter (Signed)
Calcium 9.5 on  10/05/2014 . Patient insurance benefit for Prolia pending. Phone call to pt instructed I will call her with benefits.

## 2014-10-23 NOTE — Telephone Encounter (Signed)
Insurance benefit No deductible, with OV $35 co pay no OV $0 cost for patient. No PA needed, OOP MAX $2500 [phone] met).  Calcium level 9.5 10/05/2014. Left PM for pt to return PC.

## 2014-10-24 ENCOUNTER — Encounter: Payer: Self-pay | Admitting: Gynecology

## 2014-10-24 NOTE — Telephone Encounter (Signed)
Talked with patient explained benefits. Stated she is not having  current dental work. She will call back and schedule her appointment.

## 2014-10-31 NOTE — Telephone Encounter (Signed)
Phone call from pt she would like to come in for injection on Thursday 11/02/14 at 11

## 2014-11-02 ENCOUNTER — Ambulatory Visit (INDEPENDENT_AMBULATORY_CARE_PROVIDER_SITE_OTHER): Payer: 59 | Admitting: Anesthesiology

## 2014-11-02 DIAGNOSIS — M810 Age-related osteoporosis without current pathological fracture: Secondary | ICD-10-CM

## 2014-11-02 MED ORDER — DENOSUMAB 60 MG/ML ~~LOC~~ SOLN
60.0000 mg | Freq: Once | SUBCUTANEOUS | Status: AC
  Administered 2014-11-02: 60 mg via SUBCUTANEOUS

## 2015-05-01 ENCOUNTER — Telehealth: Payer: Self-pay | Admitting: Gynecology

## 2015-05-01 NOTE — Telephone Encounter (Addendum)
Patient cost  $35 co pay . Prolia injection due after 05/05/15. Calcium 9.5 10/05/2014  . No Deductible . With or without OV $35 co pay. No PA needed. OOPM $2500 ( $59 met). Called pt to make appointment no answer.

## 2015-05-02 NOTE — Telephone Encounter (Signed)
No answer

## 2015-05-07 NOTE — Telephone Encounter (Signed)
Left message to call me.

## 2015-05-08 NOTE — Telephone Encounter (Signed)
Left detailed message on VM per pt  Request, $35 co pay with or without OV.  She was instructed to call and make Prolia appointment for injection.

## 2015-05-10 NOTE — Telephone Encounter (Addendum)
Co pay $35.00    Prolia injection on 05/17/15  Next injection due after 11/17/15

## 2015-05-17 ENCOUNTER — Ambulatory Visit: Payer: 59

## 2015-05-21 ENCOUNTER — Ambulatory Visit (INDEPENDENT_AMBULATORY_CARE_PROVIDER_SITE_OTHER): Payer: 59 | Admitting: Anesthesiology

## 2015-05-21 DIAGNOSIS — M810 Age-related osteoporosis without current pathological fracture: Secondary | ICD-10-CM | POA: Diagnosis not present

## 2015-05-21 MED ORDER — DENOSUMAB 60 MG/ML ~~LOC~~ SOLN
60.0000 mg | Freq: Once | SUBCUTANEOUS | Status: AC
  Administered 2015-05-21: 60 mg via SUBCUTANEOUS

## 2015-05-22 NOTE — Telephone Encounter (Signed)
prolia given 05/21/15  Next injection due after 11/21/15 will also need calcium level in October

## 2015-06-28 ENCOUNTER — Encounter: Payer: Self-pay | Admitting: Gynecology

## 2015-06-28 ENCOUNTER — Ambulatory Visit (INDEPENDENT_AMBULATORY_CARE_PROVIDER_SITE_OTHER): Payer: 59 | Admitting: Gynecology

## 2015-06-28 ENCOUNTER — Ambulatory Visit
Admission: RE | Admit: 2015-06-28 | Discharge: 2015-06-28 | Disposition: A | Discharge status: Home / Self Care | Payer: 59 | Source: Ambulatory Visit

## 2015-06-28 ENCOUNTER — Other Ambulatory Visit: Payer: Self-pay

## 2015-06-28 VITALS — BP 120/80 | Ht 59.5 in | Wt 130.0 lb

## 2015-06-28 DIAGNOSIS — Z1231 Encounter for screening mammogram for malignant neoplasm of breast: Secondary | ICD-10-CM

## 2015-06-28 DIAGNOSIS — Z8542 Personal history of malignant neoplasm of other parts of uterus: Secondary | ICD-10-CM

## 2015-06-28 DIAGNOSIS — Z8639 Personal history of other endocrine, nutritional and metabolic disease: Secondary | ICD-10-CM

## 2015-06-28 DIAGNOSIS — M810 Age-related osteoporosis without current pathological fracture: Secondary | ICD-10-CM | POA: Diagnosis not present

## 2015-06-28 DIAGNOSIS — Z01419 Encounter for gynecological examination (general) (routine) without abnormal findings: Secondary | ICD-10-CM

## 2015-06-28 NOTE — Progress Notes (Signed)
Janice [name] 1950/05/27 [id number]   History:    65 y.o.  for annual gyn exam with no complaints today. Patient with past history vitamin D deficiency currently on 2000 units every daily. Review of her record indicated that back in September 2016 she was started Prolia  secondary to osteoporosis.She had been on several oral bisphosphonates but could not tolerate them so she discontinued them. On the last office visit we discussed other options to include monoclonal antibody such as Prolia. She wanted to wait to see the result of this bone density study before making a decision. Back in April her vitamin D level was low and she was started on 50,000 units every weekly and is currently on 2000 units daily.  Patient's bone density study in 2016 demonstrated the following: The lowest T score was at left femoral neck with a value of -2.9 When bone density study was compared with previous study of 2008 there appeared to be a -10.1% decrease in bone mineralization of the AP spine and -9.4% decrease in bone mineralization of the right femoral neck and no change in the left femoral neck of significance.  With these findings it was share with the patient that her bone mass is 25% below normal and her risk of a hip fracture is 11 times greater than the general population. We discussed that calcium and vitamin D and regular exercise alone will not prevent her from osteoporosis on the scores. We discussed different treatment options and she would like now to proceed with a monoclonal antibody Prolia 60 mg subcutaneous every 6 months. Patient has tolerated 2 injections so far. She had her first injection in September 2016 and her second one April 2017.  Her PCP has been doing her blood work. She had a normal colonoscopy in 2016 and she is on 10 year recall. Patient in 2005 had a total abdominal hysterectomy with bilateral salpingo-oophorectomy as a result of a grade 1 stage I well differentiated endometrioid  adenocarcinoma the uterus with no recurrence up-to-date.  Patient several years ago had been diagnosed with osteoporosis dating back to 2008.   Past medical history,surgical history, family history and social history were all reviewed and documented in the EPIC chart.  Gynecologic History No LMP recorded. Patient has had a hysterectomy. Contraception: status post hysterectomy Last Pap: 2012, 2013, in 2016sults were: normal Last mammogram: results were: normal 2016   Obstetric History OB History  Gravida Para Term Preterm AB SAB TAB Ectopic Multiple Living  1    1     0    # Outcome Date GA Lbr Len/2nd Weight Sex Delivery Anes PTL Lv  1 AB                ROS: A ROS was performed and pertinent positives and negatives are included in the history.  GENERAL: No fevers or chills. HEENT: No change in vision, no earache, sore throat or sinus congestion. NECK: No pain or stiffness. CARDIOVASCULAR: No chest pain or pressure. No palpitations. PULMONARY: No shortness of breath, cough or wheeze. GASTROINTESTINAL: No abdominal pain, nausea, vomiting or diarrhea, melena or bright red blood per rectum. GENITOURINARY: No urinary frequency, urgency, hesitancy or dysuria. MUSCULOSKELETAL: No joint or muscle pain, no back pain, no recent trauma. DERMATOLOGIC: No rash, no itching, no lesions. ENDOCRINE: No polyuria, polydipsia, no heat or cold intolerance. No recent change in weight. HEMATOLOGICAL: No anemia or easy bruising or bleeding. NEUROLOGIC: No headache, seizures, numbness, tingling or weakness. PSYCHIATRIC:  No depression, no loss of interest in normal activity or change in sleep pattern.     Exam: chaperone present  BP 120/80 mmHg  Ht 4' 11.5" (1.511 m)  Wt 130 lb (58.968 kg)  BMI 25.83 kg/m2  Body mass index is 25.83 kg/(m^2).  General appearance : Well developed well nourished female. No acute distress HEENT: Eyes: no retinal hemorrhage or exudates,  Neck supple, trachea midline, no  carotid bruits, no thyroidmegaly Lungs: Clear to auscultation, no rhonchi or wheezes, or rib retractions  Heart: Regular rate and rhythm, no murmurs or gallops Breast:Examined in sitting and supine position were symmetrical in appearance, no palpable masses or tenderness,  no skin retraction, no nipple inversion, no nipple discharge, no skin discoloration, no axillary or supraclavicular lymphadenopathy Abdomen: no palpable masses or tenderness, no rebound or guarding Extremities: no edema or skin discoloration or tenderness  Pelvic:  Bartholin, Urethra, Skene Glands: Within normal limits             Vagina: No gross lesions or discharge  Cervix: Absent  Uterus absent  Adnexa  Without masses or tenderness  Anus and perineum  normal   Rectovaginal  normal sphincter tone without palpated masses or tenderness             Hemoccult colonoscopy 2016 normal     Assessment/Plan:  65 y.o. female for annual exam with osteoporosis tolerating Prolia well. She will need a follow-up bone density study next year. Because of her history vitamin D deficiency we'll going to check a vitamin D level today. Her PCP is been doing the remainder of her blood work. Patient with past history of total abdominal hysterectomy with bilateral salpingo-oophorectomy secondary to grade 1 stage I well-differentiated endometrioid adenocarcinoma of the uterus with no evidence of recurrence and doing well. Patient was provided with a requisition to schedule her overdue mammogram to request a three-dimensional mammogram due to recent report indicating her breasts were dense. She is encouraged also to do monthly breast exam. We discussed importance of calcium vitamin D and weightbearing exercises for osteoporosis prevention.   Terrance Mass MD, 12:15 PM 06/28/2015

## 2015-06-29 LAB — VITAMIN D 25 HYDROXY (VIT D DEFICIENCY, FRACTURES): Vit D, 25-Hydroxy: 40 ng/mL (ref 30–100)

## 2015-07-03 LAB — PAP IG W/ RFLX HPV ASCU

## 2015-10-23 ENCOUNTER — Telehealth: Payer: Self-pay | Admitting: Gynecology

## 2015-10-23 NOTE — Telephone Encounter (Signed)
Prolia due after 11/21/2015. Insurance no deductible, No PA, No Referral, Co pay $25 with OV.  100% coverage no OV. $0 cost to patient. Need Calcium level  ? PCP Ernestene Kiel, MD

## 2015-11-12 NOTE — Telephone Encounter (Signed)
Pc to pt LM on VM to call me in regards to insurance benefits and Calcium level.

## 2015-11-15 NOTE — Telephone Encounter (Signed)
PC from [place] she has Calcium level from PCP , she wanted to come in 11/16/15 I called and left a voice message that she cannot come in on this date. Her injection must be after 11/21/15. I asked her to leave me and date and time on my VM at ext [phone]

## 2015-11-19 NOTE — Telephone Encounter (Signed)
LM on mobile for pt for Prolia injection after 11/21/15 . Also bring in lab must have lab prior to receiving  injection.

## 2015-11-21 NOTE — Telephone Encounter (Signed)
pat callled stating coming in today for Prolia, I called her told her to make appointments for the 19th not today too early. Also Must have calcium report for injection

## 2015-11-27 NOTE — Telephone Encounter (Signed)
PC to pt asked her to call and make appointment for Prolia injection Vm box full unable to leave a message

## 2015-12-11 NOTE — Telephone Encounter (Signed)
Prolia injection schedule for 12/13/15 at 3 30 pm, PATIENT MUST BRING IN CALCIUM LEVEL RESULTS PRIOR TO INJECTION.

## 2015-12-13 ENCOUNTER — Ambulatory Visit (INDEPENDENT_AMBULATORY_CARE_PROVIDER_SITE_OTHER): Payer: 59 | Admitting: Anesthesiology

## 2015-12-13 DIAGNOSIS — M810 Age-related osteoporosis without current pathological fracture: Secondary | ICD-10-CM | POA: Diagnosis not present

## 2015-12-13 MED ORDER — DENOSUMAB 60 MG/ML ~~LOC~~ SOLN
60.0000 mg | Freq: Once | SUBCUTANEOUS | Status: AC
  Administered 2015-12-13: 60 mg via SUBCUTANEOUS

## 2015-12-18 NOTE — Telephone Encounter (Signed)
Calcium 9.2   08/17/15 prolia given 12/13/15 next due after 06/12/16

## 2015-12-26 ENCOUNTER — Encounter: Payer: Self-pay | Admitting: Anesthesiology

## 2016-05-20 ENCOUNTER — Telehealth: Payer: Self-pay | Admitting: Gynecology

## 2016-05-20 NOTE — Telephone Encounter (Signed)
[employer] SOB insurance no longer active need new informtion for insurance card.

## 2016-06-04 NOTE — Telephone Encounter (Signed)
VC from pt has new insurance [place] ID # [id number]    GROUP # [id number]  [phone] CALCIUM LEVEL 08/17/15  9.2  COMPLETE EXAM JF 06/2015  NEXT SCHEDULED  06/2016 NEW INFORMATION TO [employer] TODAY

## 2016-06-09 NOTE — Telephone Encounter (Addendum)
Prolia  [id number] due 06/12/16. M81.0   Calcium 9.2 08/17/15   Deductible met . With OV $35 co pay, No OV 100% coverage. PA needed. Ref # [id number]  Aprroval 06/12/16  thru 06/12/2017  120 mg / two injections one every 6 months. PC to pt she will call back and schedule appointment for Prolia. Complete exam with JF 07/02/16

## 2016-06-12 NOTE — Telephone Encounter (Addendum)
She will call scheduling she only needs nurse appointment for injection complete exam and Prolia with JF 06/24/16

## 2016-06-18 ENCOUNTER — Encounter: Payer: Self-pay | Admitting: Gynecology

## 2016-06-19 ENCOUNTER — Encounter: Payer: Self-pay | Admitting: Anesthesiology

## 2016-06-24 ENCOUNTER — Encounter: Payer: Self-pay | Admitting: Gynecology

## 2016-06-24 ENCOUNTER — Ambulatory Visit (INDEPENDENT_AMBULATORY_CARE_PROVIDER_SITE_OTHER): Payer: Managed Care, Other (non HMO) | Admitting: Gynecology

## 2016-06-24 VITALS — BP 124/80 | Ht 60.0 in | Wt 140.0 lb

## 2016-06-24 DIAGNOSIS — Z01419 Encounter for gynecological examination (general) (routine) without abnormal findings: Secondary | ICD-10-CM

## 2016-06-24 DIAGNOSIS — E559 Vitamin D deficiency, unspecified: Secondary | ICD-10-CM | POA: Diagnosis not present

## 2016-06-24 DIAGNOSIS — M810 Age-related osteoporosis without current pathological fracture: Secondary | ICD-10-CM

## 2016-06-24 MED ORDER — DENOSUMAB 60 MG/ML ~~LOC~~ SOLN
60.0000 mg | Freq: Once | SUBCUTANEOUS | Status: AC
  Administered 2016-06-24: 60 mg via SUBCUTANEOUS

## 2016-06-24 NOTE — Patient Instructions (Signed)
Bone Densitometry Bone densitometry is an imaging test that uses a special X-ray to measure the amount of calcium and other minerals in your bones (bone density). This test is also known as a bone mineral density test or dual-energy X-ray absorptiometry (DXA). The test can measure bone density at your hip and your spine. It is similar to having a regular X-ray. You may have this test to:  Diagnose a condition that causes weak or thin bones (osteoporosis).  Predict your risk of a broken bone (fracture).  Determine how well osteoporosis treatment is working. Tell a health care provider about:  Any allergies you have.  All medicines you are taking, including vitamins, herbs, eye drops, creams, and over-the-counter medicines.  Any problems you or family members have had with anesthetic medicines.  Any blood disorders you have.  Any surgeries you have had.  Any medical conditions you have.  Possibility of pregnancy.  Any other medical test you had within the previous 14 days that used contrast material. What are the risks? Generally, this is a safe procedure. However, problems can occur and may include the following:  This test exposes you to a very small amount of radiation.  The risks of radiation exposure may be greater to unborn children. What happens before the procedure?  Do not take any calcium supplements for 24 hours before having the test. You can otherwise eat and drink what you usually do.  Take off all metal jewelry, eyeglasses, dental appliances, and any other metal objects. What happens during the procedure?  You may lie on an exam table. There will be an X-ray generator below you and an imaging device above you.  Other devices, such as boxes or braces, may be used to position your body properly for the scan.  You will need to lie still while the machine slowly scans your body.  The images will show up on a computer monitor. What happens after the  procedure? You may need more testing at a later time. This information is not intended to replace advice given to you by your health care provider. Make sure you discuss any questions you have with your health care provider. Document Released: 02/12/2004 Document Revised: 06/28/2015 Document Reviewed: 06/30/2013 Elsevier Interactive Patient Education  2017 Elsevier Inc.  

## 2016-06-24 NOTE — Progress Notes (Signed)
Janice [name] 26-Jun-1950 [id number]   History:    66 y.o.  for annual gyn exam with no complaints today. Patient with past history of vitamin D deficiency currently on vitamin D 2000 units daily.Review of her record indicated that back in September 2016 she was started Prolia  secondary to osteoporosis.She had been on several oral bisphosphonates but could not tolerate them so she discontinued them.Patient's bone density study in 2016 demonstrated the following:  The lowest T score was at left femoral neck with a value of -2.9 When bone density study was compared with previous study of 2008 there appeared to be a -10.1% decrease in bone mineralization of the AP spine and -9.4% decrease in bone mineralization of the right femoral neck and no change in the left femoral neck of significance.  Patient received her today.Prolia Her PCP has been doing her blood work. She had a normal colonoscopy in 2016 and she is on 10 year recall. Patient in 2005 had a total abdominal hysterectomy with bilateral salpingo-oophorectomy as a result of a grade 1 stage I well differentiated endometrioid adenocarcinoma the uterus with no recurrence up-to-date.   Past medical history,surgical history, family history and social history were all reviewed and documented in the EPIC chart.  Gynecologic History No LMP recorded. Patient has had a hysterectomy. Contraception: status post hysterectomy Last Pap2017Results were: normal Last mammogram were: Normal but dense had three-dimensional mammogram   Obstetric History OB History  Gravida Para Term Preterm AB Living  1       1 0  SAB TAB Ectopic Multiple Live Births               # Outcome Date GA Lbr Len/2nd Weight Sex Delivery Anes PTL Lv  1 AB                ROS: A ROS was performed and pertinent positives and negatives are included in the history.  GENERAL: No fevers or chills. HEENT: No change in vision, no earache, sore throat or sinus congestion.  NECK: No pain or stiffness. CARDIOVASCULAR: No chest pain or pressure. No palpitations. PULMONARY: No shortness of breath, cough or wheeze. GASTROINTESTINAL: No abdominal pain, nausea, vomiting or diarrhea, melena or bright red blood per rectum. GENITOURINARY: No urinary frequency, urgency, hesitancy or dysuria. MUSCULOSKELETAL: No joint or muscle pain, no back pain, no recent trauma. DERMATOLOGIC: No rash, no itching, no lesions. ENDOCRINE: No polyuria, polydipsia, no heat or cold intolerance. No recent change in weight. HEMATOLOGICAL: No anemia or easy bruising or bleeding. NEUROLOGIC: No headache, seizures, numbness, tingling or weakness. PSYCHIATRIC: No depression, no loss of interest in normal activity or change in sleep pattern.     Exam: chaperone present  BP 124/80   Ht 5' (1.524 m)   Wt 140 lb (63.5 kg)   BMI 27.34 kg/m   Body mass index is 27.34 kg/m.  General appearance : Well developed well nourished female. No acute distress HEENT: Eyes: no retinal hemorrhage or exudates,  Neck supple, trachea midline, no carotid bruits, no thyroidmegaly Lungs: Clear to auscultation, no rhonchi or wheezes, or rib retractions  Heart: Regular rate and rhythm, no murmurs or gallops Breast:Examined in sitting and supine position were symmetrical in appearance, no palpable masses or tenderness,  no skin retraction, no nipple inversion, no nipple discharge, no skin discoloration, no axillary or supraclavicular lymphadenopathy Abdomen: no palpable masses or tenderness, no rebound or guarding Extremities: no edema or skin discoloration or tenderness  Pelvic:  Bartholin, Urethra, Skene Glands: Within normal limits             Vagina: No gross lesions or discharge, atrophic changes   Cervix: Absent  UterusAbsent Adnexa  Without masses or tenderness  Anus and perineum  normal   Rectovaginal  normal sphincter tone without palpated masses or tenderness             HemoccultPCP provides     assessment/plan: Patient with past history of total abdominal hysterectomy with bilateral salpingo-oophorectomy secondary to grade 1 stage I well-differentiated endometrioid adenocarcinoma of the uterus with no evidence of recurrence and doing well. Patient with history of osteoporosis on since September 2016 received her Prolia injection today. Because of her past history vitamin D deficiency a vitamin D level will be obtained today. Because of her history of endometrial cancer Pap smear was done today as well. Her PCP has been doing her blood work. Patient was reminded to schedule her mammogram which is due and also her bone density study.    Terrance Mass MD, 10:29 AM 06/24/2016

## 2016-06-24 NOTE — Addendum Note (Signed)
Addended by: Nelva Nay on: 06/24/2016 12:12 PM   Modules accepted: Orders

## 2016-06-24 NOTE — Telephone Encounter (Signed)
Prolia given next due 12/26/16

## 2016-06-24 NOTE — Addendum Note (Signed)
Addended by: Nelva Nay on: 06/24/2016 11:54 AM   Modules accepted: Orders

## 2016-06-26 LAB — PAP IG W/ RFLX HPV ASCU

## 2016-07-01 ENCOUNTER — Encounter: Payer: 59 | Admitting: Gynecology

## 2016-07-02 ENCOUNTER — Encounter: Payer: 59 | Admitting: Gynecology

## 2016-07-17 ENCOUNTER — Ambulatory Visit (INDEPENDENT_AMBULATORY_CARE_PROVIDER_SITE_OTHER): Payer: Managed Care, Other (non HMO)

## 2016-07-17 DIAGNOSIS — E559 Vitamin D deficiency, unspecified: Secondary | ICD-10-CM

## 2016-07-17 DIAGNOSIS — M810 Age-related osteoporosis without current pathological fracture: Secondary | ICD-10-CM | POA: Diagnosis not present

## 2016-07-29 IMAGING — MG MM SCREENING BREAST TOMO BILATERAL
8 of 13 series · 8 of 29 positions shown · non-contrast
Comparison: Previous exam(s).

CLINICAL DATA: Screening.

EXAM:
2D DIGITAL SCREENING BILATERAL MAMMOGRAM WITH CAD AND ADJUNCT TOMO

[L MLO (1 of 2)]
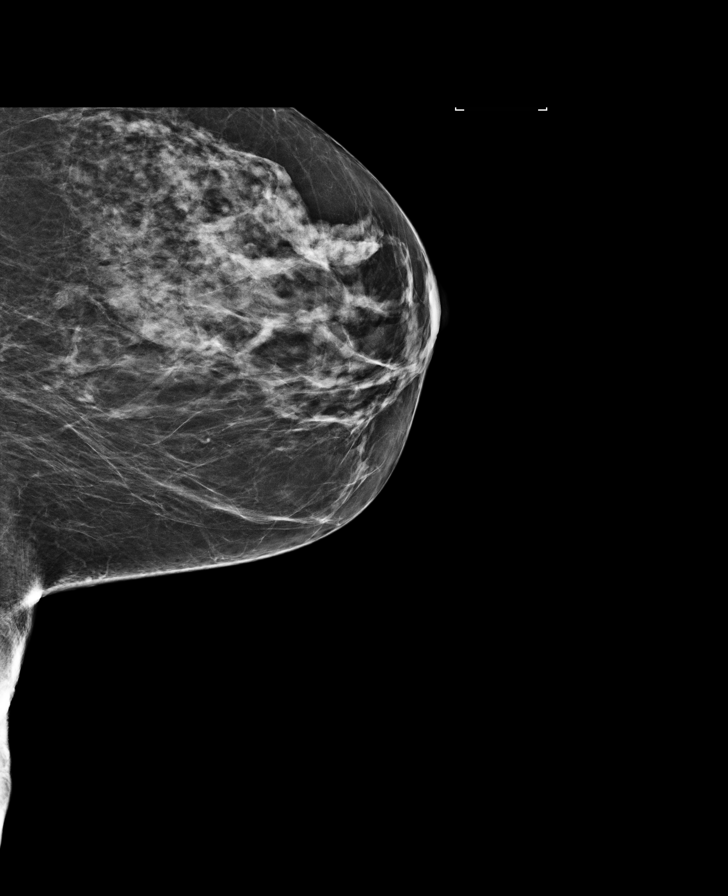

[R MLO]
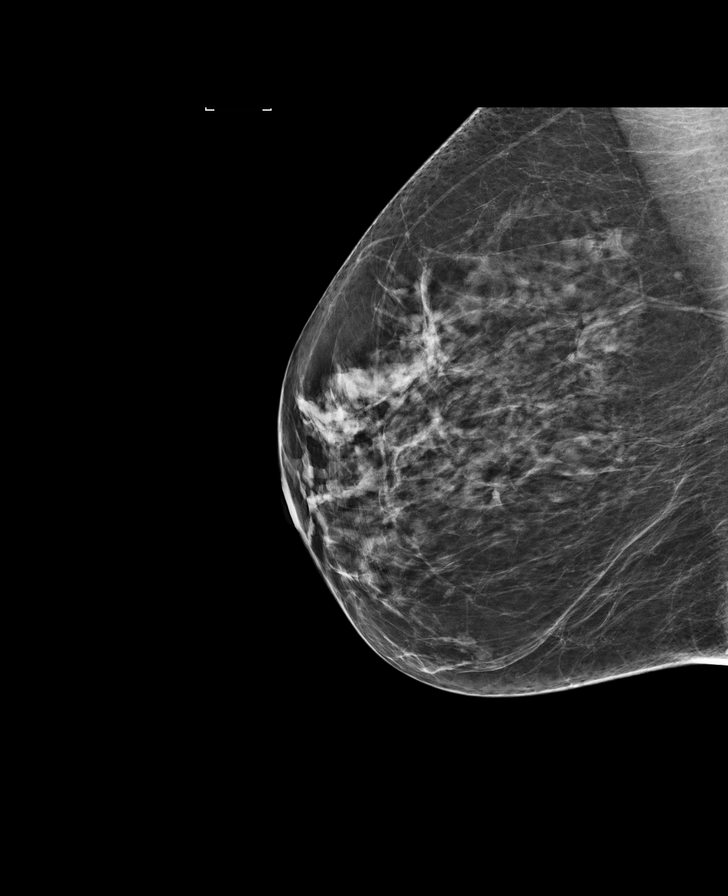

[L MLO (2 of 2)]
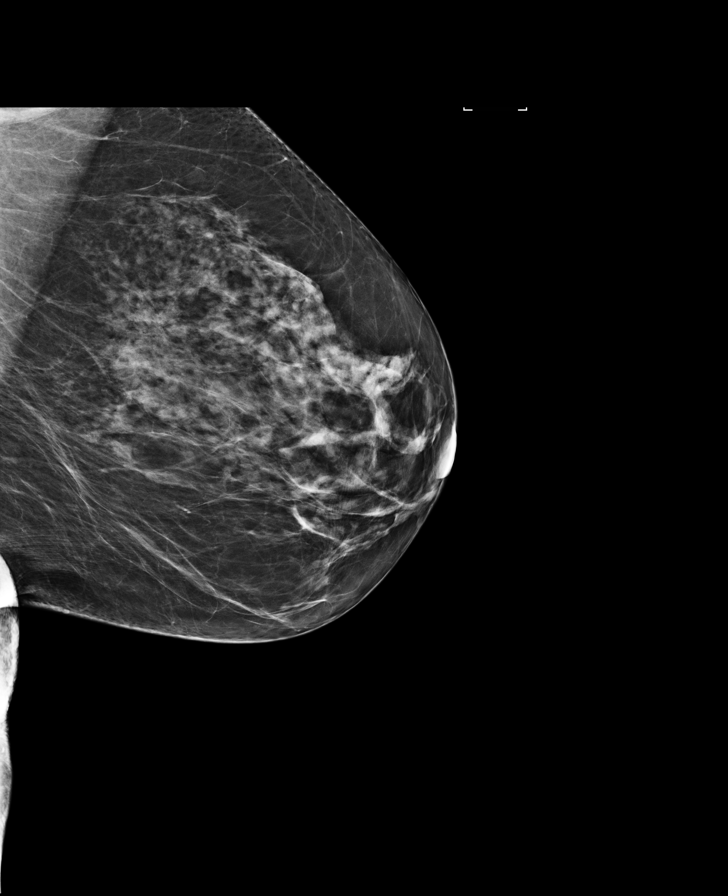

[L MLO synth-2D]
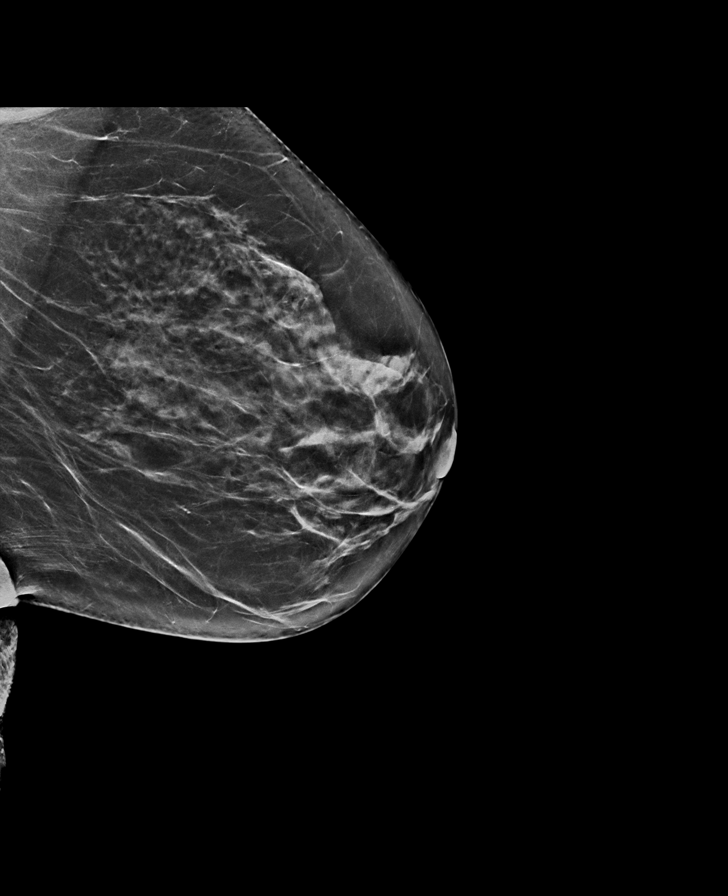

[R MLO synth-2D]
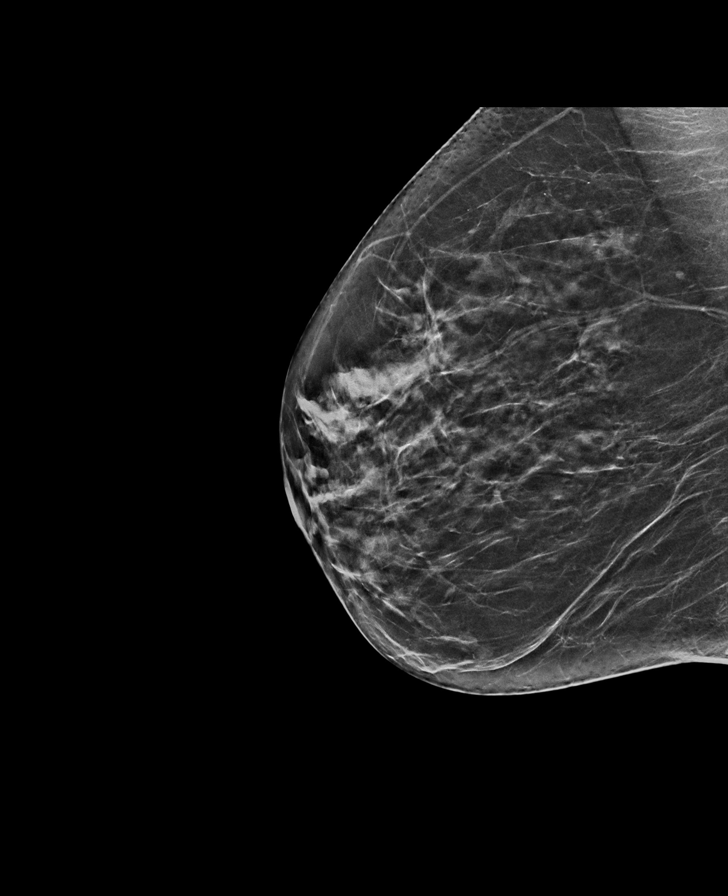

[R CC synth-2D]
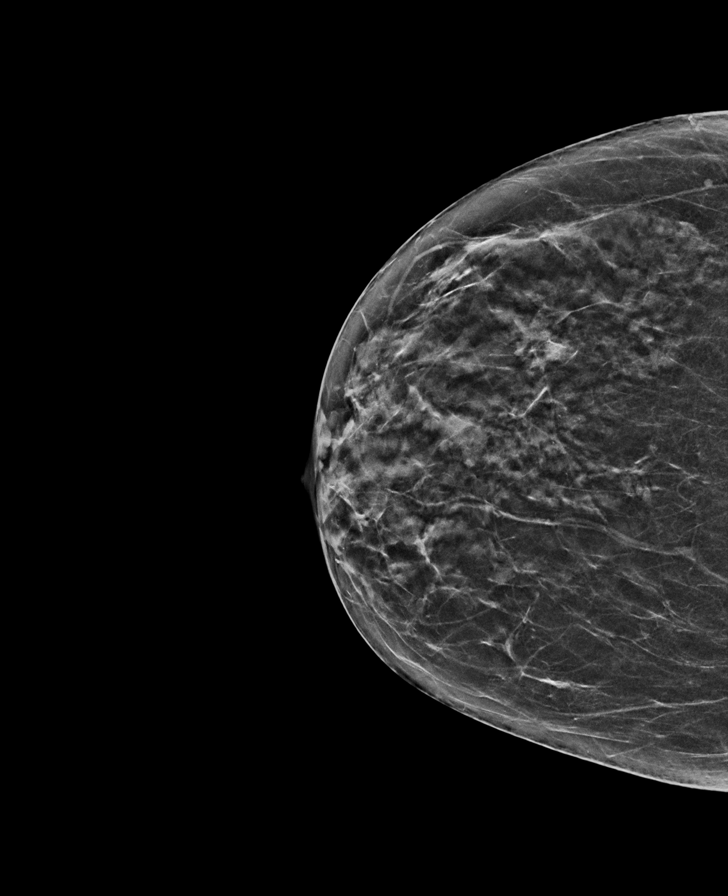

[L CC]
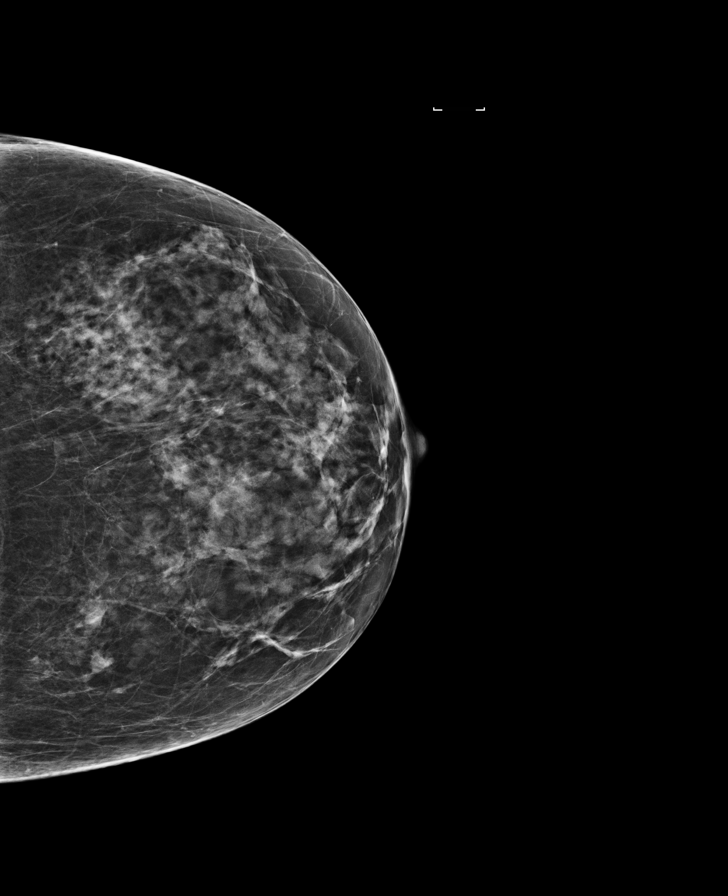

[R CC]
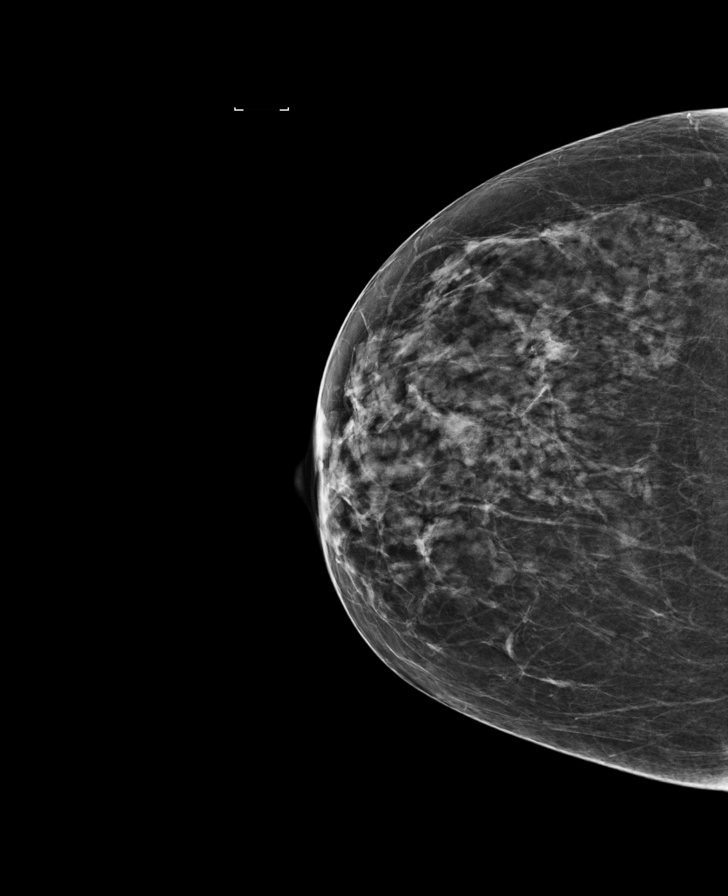

[8 of 29 positions shown; findings below may reference images not displayed]

ACR Breast Density Category c: The breast tissue is heterogeneously
dense, which may obscure small masses.
FINDINGS: There are no findings suspicious for malignancy. Images were
processed with CAD.
IMPRESSION: No mammographic evidence of malignancy. A result letter of this
screening mammogram will be mailed directly to the patient.

RECOMMENDATION:
Screening mammogram in one year. (Code:TN-[id number])

BI-RADS CATEGORY  1: Negative.

## 2016-08-28 ENCOUNTER — Telehealth: Payer: Self-pay | Admitting: Gynecology

## 2016-09-01 NOTE — Telephone Encounter (Signed)
PC from, pt stating she has Prolia card. I called her and told her to bring in card , with SOB from insurance company and bill from [place] . We are to make copy and also have pt sign financial agreement for Prolia benefit card. I will forward to Sharrie Rothman

## 2016-09-09 NOTE — Telephone Encounter (Signed)
No answer from pt regarding Prolia card and sign financial statement. Asked her to fax copy of card or bring to office. Also need her to call me.

## 2016-09-16 NOTE — Telephone Encounter (Signed)
I will send this note to Sharrie Rothman due to this encounter is a billing issue.

## 2016-09-23 NOTE — Telephone Encounter (Signed)
Pt billed. KW CMA

## 2016-12-08 ENCOUNTER — Telehealth: Payer: Self-pay | Admitting: Gynecology

## 2016-12-29 NOTE — Telephone Encounter (Addendum)
Complete 06/2016 JF, Calcium 9.4  03/03/16 .  Appointment 01/02/17 10 am for injection, Insurance PA ref # [id number] valid 06/09/16  Thru 06/09/17 verified electronic by phone unable to talk to rep. [employer]

## 2017-01-02 ENCOUNTER — Ambulatory Visit: Payer: Managed Care, Other (non HMO)

## 2017-01-05 NOTE — Telephone Encounter (Signed)
Insurance information, Pt has a $35 co pay, Pt has OOPM of $2500 ($936 met),  NO deductible, Medication under medical  benefit must apply cost to  OOPM Per Gerald Stabs at [employer] Ref # [id number], PA is not effective for TF, must have new PA , last PA JF pt, PA Number same OP [id number] valid  06/09/16  Thru 06/09/17 Provider changed to Basil Dess, ,MD. Pt has Prolia card with funds available.

## 2017-01-07 ENCOUNTER — Ambulatory Visit (INDEPENDENT_AMBULATORY_CARE_PROVIDER_SITE_OTHER): Payer: Managed Care, Other (non HMO) | Admitting: *Deleted

## 2017-01-07 ENCOUNTER — Encounter: Payer: Self-pay | Admitting: Gynecology

## 2017-01-07 DIAGNOSIS — M810 Age-related osteoporosis without current pathological fracture: Secondary | ICD-10-CM | POA: Diagnosis not present

## 2017-01-07 MED ORDER — DENOSUMAB 60 MG/ML ~~LOC~~ SOLN
60.0000 mg | Freq: Once | SUBCUTANEOUS | Status: AC
  Administered 2017-01-07: 60 mg via SUBCUTANEOUS

## 2017-01-07 NOTE — Telephone Encounter (Signed)
Prolia Given 01/07/17 Next Prolia injection 07/09/16

## 2017-06-19 ENCOUNTER — Telehealth: Payer: Self-pay | Admitting: *Deleted

## 2017-06-19 DIAGNOSIS — M810 Age-related osteoporosis without current pathological fracture: Secondary | ICD-10-CM

## 2017-06-19 NOTE — Telephone Encounter (Signed)
Requested. Be here 1st of the month.   Previous Messages    ----- Message -----  From: Enrigue Catena, RMA  Sent: 06/19/2017 10:42 AM  To: Sinclair Grooms  Subject: chart request                   Hey can you do me a favor and request this patient chart for me       I requested this chart to get correct oral bisphosphonate's to put down on Prolia form to run benefits

## 2017-06-25 ENCOUNTER — Encounter: Payer: Managed Care, Other (non HMO) | Admitting: Gynecology

## 2017-07-06 NOTE — Telephone Encounter (Signed)
Per chart pt has tried Janice [name] dictation 05/01/10 paper chart

## 2017-07-06 NOTE — Telephone Encounter (Signed)
Prolia insurance verification has been sent awaiting Summary of benefits  

## 2017-07-23 ENCOUNTER — Encounter: Payer: Managed Care, Other (non HMO) | Admitting: Gynecology

## 2017-07-23 NOTE — Telephone Encounter (Addendum)
Deductible AMOUNT MET   OOP MAX $3000, ($51mt)  Annual exam (pt states she will check her work schedule and give uKoreaa call back  Calcium  9.9           Date 07/29/17  Upcoming dental procedures NO  Prior Authorization needed YES in progress  rior Authorization request details:  Prior AThayer Jew ID:  [id number] Drug/Service Name:  [id number] MG/ML SYRINGE  Patient:  Janice [name] Date Requested:  07/23/2017 10:16:53 AM    MemberID:  [id number] DOB:  0Mar 11, 1952##[id number] number [phone] Approved 07/24/17-07/25/2018  Pt estimated Cost $40       Coverage Details:

## 2017-07-29 ENCOUNTER — Other Ambulatory Visit: Payer: Managed Care, Other (non HMO)

## 2017-07-29 DIAGNOSIS — M810 Age-related osteoporosis without current pathological fracture: Secondary | ICD-10-CM

## 2017-07-29 LAB — CALCIUM: Calcium: 9.9 mg/dL (ref 8.6–10.4)

## 2017-07-31 NOTE — Telephone Encounter (Signed)
Called pt calcium labs are within normal range. Tried to set up appt regarding Prolia injection. Pt states she will call back to set up Prolia injection.  This is second attempt to set up appt for Prolia   Will let pt call us back per our conversation closing encounter  Summary of benefits scanned in system

## 2017-08-14 NOTE — Telephone Encounter (Signed)
Upcoming annual with [facility] 08/17/17 Will have Prolia same day

## 2017-08-17 ENCOUNTER — Encounter: Payer: Self-pay | Admitting: Women's Health

## 2017-08-17 ENCOUNTER — Ambulatory Visit (INDEPENDENT_AMBULATORY_CARE_PROVIDER_SITE_OTHER): Payer: Managed Care, Other (non HMO) | Admitting: Women's Health

## 2017-08-17 VITALS — BP 134/86 | Ht 59.5 in | Wt 129.0 lb

## 2017-08-17 DIAGNOSIS — M810 Age-related osteoporosis without current pathological fracture: Secondary | ICD-10-CM | POA: Diagnosis not present

## 2017-08-17 DIAGNOSIS — Z01419 Encounter for gynecological examination (general) (routine) without abnormal findings: Secondary | ICD-10-CM | POA: Diagnosis not present

## 2017-08-17 MED ORDER — AZITHROMYCIN 500 MG PO TABS: 1000.0000 mg | ORAL_TABLET | Freq: Every day | ORAL | 0 refills | Status: DC

## 2017-08-17 MED ORDER — DENOSUMAB 60 MG/ML ~~LOC~~ SOSY
60.0000 mg | PREFILLED_SYRINGE | Freq: Once | SUBCUTANEOUS | Status: AC
  Administered 2017-08-17: 60 mg via SUBCUTANEOUS

## 2017-08-17 MED ORDER — DICYCLOMINE HCL 10 MG PO CAPS: 10.0000 mg | ORAL_CAPSULE | Freq: Three times a day (TID) | ORAL | 1 refills | Status: AC

## 2017-08-17 MED ORDER — BETAMETHASONE VALERATE 0.1 % EX CREA: TOPICAL_CREAM | Freq: Two times a day (BID) | CUTANEOUS | 0 refills | Status: DC

## 2017-08-17 NOTE — Progress Notes (Signed)
Janice [name] 26-Jan-1951 [id number]    History:    Presents for annual exam.  2005 TAH for stage I endometrial cancer and normal Paps after.  Normal mammogram history.  Primary care manages anxiety/depression, hypothyroidism.  Osteoporosis started on Prolia 07/2017, 07/2016 DEXA showed improvement in all areas.  2016- colonoscopy.  Has had several episodes of nausea, upset stomach with some diarrhea periodically over the last few months with no known cause, weight down 15 pounds.  Not sexually active in years  Past medical history, past surgical history, family history and social history were all reviewed and documented in the EPIC chart.  Planning to retire in September.  ROS:  A ROS was performed and pertinent positives and negatives are included.  Exam:  Vitals:   08/17/17 1051  BP: 134/86  Weight: 129 lb (58.5 kg)  Height: 4' 11.5" (1.511 m)   Body mass index is 25.62 kg/m.   General appearance:  Normal Thyroid:  Symmetrical, normal in size, without palpable masses or nodularity. Respiratory  Auscultation:  Clear without wheezing or rhonchi Cardiovascular  Auscultation:  Regular rate, without rubs, murmurs or gallops  Edema/varicosities:  Not grossly evident Abdominal  Soft,nontender, without masses, guarding or rebound.  Liver/spleen:  No organomegaly noted  Hernia:  None appreciated  Skin  Inspection:  Grossly normal   Breasts: Examined lying and sitting.     Right: Without masses, retractions, discharge or axillary adenopathy.     Left: Without masses, retractions, discharge or axillary adenopathy. Gentitourinary   Inguinal/mons:  Normal without inguinal adenopathy  External genitalia:  Normal  BUS/Urethra/Skene's glands:  Normal  Vagina:  Normal  Cervix: And uterus absent  Adnexa/parametria:     Rt: Without masses or tenderness.   Lt: Without masses or tenderness.  Anus and perineum: Normal  Digital rectal exam: Normal sphincter tone without palpated masses or  tenderness  Assessment/Plan:  66 y.o. WF G01P for annual exam with occasional nausea/vomiting/diarrhea with no known cause.  Postmenopausal on no HRT 2005 TAH for stage I endometrial cancer Osteoporosis on Prolia since 07/2017 with improvement Anxiety/depression/hypothyroidism/hypercholesteremia-primary care manages labs and meds  Pain: Pap .  Reviewed importance of condoms if becomes sexually active.  SBE's, continue annual screening mammogram overdue instructed to schedule.  Bentyl 10 mg before meals and at bedtime with next episode of nausea, vomiting and diarrhea.  Follow-up with primary care if no relief.  Strongly encouraged to increase regular weightbearing/cardio type exercise, yoga, fall prevention, home safety reviewed.  Prolia 60 mg sub q. twice yearly will continue. Normal calcium.    Calvin, 1:30 PM 08/17/2017

## 2017-08-17 NOTE — Patient Instructions (Signed)
Health Maintenance, Female Adopting a healthy lifestyle and getting preventive care can go a long way to promote health and wellness. Talk with your health care provider about what schedule of regular examinations is right for you. This is a good chance for you to check in with your provider about disease prevention and staying healthy. In between checkups, there are plenty of things you can do on your own. Experts have done a lot of research about which lifestyle changes and preventive measures are most likely to keep you healthy. Ask your health care provider for more information. Weight and diet Eat a healthy diet  Be sure to include plenty of vegetables, fruits, low-fat dairy products, and lean protein.  Do not eat a lot of foods high in solid fats, added sugars, or salt.  Get regular exercise. This is one of the most important things you can do for your health. ? Most adults should exercise for at least 150 minutes each week. The exercise should increase your heart rate and make you sweat (moderate-intensity exercise). ? Most adults should also do strengthening exercises at least twice a week. This is in addition to the moderate-intensity exercise.  Maintain a healthy weight  Body mass index (BMI) is a measurement that can be used to identify possible weight problems. It estimates body fat based on height and weight. Your health care provider can help determine your BMI and help you achieve or maintain a healthy weight.  For females 20 years of age and older: ? A BMI below 18.5 is considered underweight. ? A BMI of 18.5 to 24.9 is normal. ? A BMI of 25 to 29.9 is considered overweight. ? A BMI of 30 and above is considered obese.  Watch levels of cholesterol and blood lipids  You should start having your blood tested for lipids and cholesterol at 65 years of age, then have this test every 5 years.  You may need to have your cholesterol levels checked more often if: ? Your lipid or  cholesterol levels are high. ? You are older than 65 years of age. ? You are at high risk for heart disease.  Cancer screening Lung Cancer  Lung cancer screening is recommended for adults 55-80 years old who are at high risk for lung cancer because of a history of smoking.  A yearly low-dose CT scan of the lungs is recommended for people who: ? Currently smoke. ? Have quit within the past 15 years. ? Have at least a 30-pack-year history of smoking. A pack year is smoking an average of one pack of cigarettes a day for 1 year.  Yearly screening should continue until it has been 15 years since you quit.  Yearly screening should stop if you develop a health problem that would prevent you from having lung cancer treatment.  Breast Cancer  Practice breast self-awareness. This means understanding how your breasts normally appear and feel.  It also means doing regular breast self-exams. Let your health care provider know about any changes, no matter how small.  If you are in your 20s or 30s, you should have a clinical breast exam (CBE) by a health care provider every 1-3 years as part of a regular health exam.  If you are 40 or older, have a CBE every year. Also consider having a breast X-ray (mammogram) every year.  If you have a family history of breast cancer, talk to your health care provider about genetic screening.  If you are at high risk   for breast cancer, talk to your health care provider about having an MRI and a mammogram every year.  Breast cancer gene (BRCA) assessment is recommended for women who have family members with BRCA-related cancers. BRCA-related cancers include: ? Breast. ? Ovarian. ? Tubal. ? Peritoneal cancers.  Results of the assessment will determine the need for genetic counseling and BRCA1 and BRCA2 testing.  Cervical Cancer Your health care provider may recommend that you be screened regularly for cancer of the pelvic organs (ovaries, uterus, and  vagina). This screening involves a pelvic examination, including checking for microscopic changes to the surface of your cervix (Pap test). You may be encouraged to have this screening done every 3 years, beginning at age 22.  For women ages 56-65, health care providers may recommend pelvic exams and Pap testing every 3 years, or they may recommend the Pap and pelvic exam, combined with testing for human papilloma virus (HPV), every 5 years. Some types of HPV increase your risk of cervical cancer. Testing for HPV may also be done on women of any age with unclear Pap test results.  Other health care providers may not recommend any screening for nonpregnant women who are considered low risk for pelvic cancer and who do not have symptoms. Ask your health care provider if a screening pelvic exam is right for you.  If you have had past treatment for cervical cancer or a condition that could lead to cancer, you need Pap tests and screening for cancer for at least 20 years after your treatment. If Pap tests have been discontinued, your risk factors (such as having a new sexual partner) need to be reassessed to determine if screening should resume. Some women have medical problems that increase the chance of getting cervical cancer. In these cases, your health care provider may recommend more frequent screening and Pap tests.  Colorectal Cancer  This type of cancer can be detected and often prevented.  Routine colorectal cancer screening usually begins at 65 years of age and continues through 65 years of age.  Your health care provider may recommend screening at an earlier age if you have risk factors for colon cancer.  Your health care provider may also recommend using home test kits to check for hidden blood in the stool.  A small camera at the end of a tube can be used to examine your colon directly (sigmoidoscopy or colonoscopy). This is done to check for the earliest forms of colorectal  cancer.  Routine screening usually begins at age 33.  Direct examination of the colon should be repeated every 5-10 years through 65 years of age. However, you may need to be screened more often if early forms of precancerous polyps or small growths are found.  Skin Cancer  Check your skin from head to toe regularly.  Tell your health care provider about any new moles or changes in moles, especially if there is a change in a mole's shape or color.  Also tell your health care provider if you have a mole that is larger than the size of a pencil eraser.  Always use sunscreen. Apply sunscreen liberally and repeatedly throughout the day.  Protect yourself by wearing long sleeves, pants, a wide-brimmed hat, and sunglasses whenever you are outside.  Heart disease, diabetes, and high blood pressure  High blood pressure causes heart disease and increases the risk of stroke. High blood pressure is more likely to develop in: ? People who have blood pressure in the high end of  the normal range (130-139/85-89 mm Hg). ? People who are overweight or obese. ? People who are African American.  If you are 21-29 years of age, have your blood pressure checked every 3-5 years. If you are 3 years of age or older, have your blood pressure checked every year. You should have your blood pressure measured twice-once when you are at a hospital or clinic, and once when you are not at a hospital or clinic. Record the average of the two measurements. To check your blood pressure when you are not at a hospital or clinic, you can use: ? An automated blood pressure machine at a pharmacy. ? A home blood pressure monitor.  If you are between 17 years and 37 years old, ask your health care provider if you should take aspirin to prevent strokes.  Have regular diabetes screenings. This involves taking a blood sample to check your fasting blood sugar level. ? If you are at a normal weight and have a low risk for diabetes,  have this test once every three years after 65 years of age. ? If you are overweight and have a high risk for diabetes, consider being tested at a younger age or more often. Preventing infection Hepatitis B  If you have a higher risk for hepatitis B, you should be screened for this virus. You are considered at high risk for hepatitis B if: ? You were born in a country where hepatitis B is common. Ask your health care provider which countries are considered high risk. ? Your parents were born in a high-risk country, and you have not been immunized against hepatitis B (hepatitis B vaccine). ? You have HIV or AIDS. ? You use needles to inject street drugs. ? You live with someone who has hepatitis B. ? You have had sex with someone who has hepatitis B. ? You get hemodialysis treatment. ? You take certain medicines for conditions, including cancer, organ transplantation, and autoimmune conditions.  Hepatitis C  Blood testing is recommended for: ? Everyone born from [birth year] through 1963. ? Anyone with known risk factors for hepatitis C.  Sexually transmitted infections (STIs)  You should be screened for sexually transmitted infections (STIs) including gonorrhea and chlamydia if: ? You are sexually active and are younger than 65 years of age. ? You are older than 65 years of age and your health care provider tells you that you are at risk for this type of infection. ? Your sexual activity has changed since you were last screened and you are at an increased risk for chlamydia or gonorrhea. Ask your health care provider if you are at risk.  If you do not have HIV, but are at risk, it may be recommended that you take a prescription medicine daily to prevent HIV infection. This is called pre-exposure prophylaxis (PrEP). You are considered at risk if: ? You are sexually active and do not regularly use condoms or know the HIV status of your partner(s). ? You take drugs by injection. ? You are  sexually active with a partner who has HIV.  Talk with your health care provider about whether you are at high risk of being infected with HIV. If you choose to begin PrEP, you should first be tested for HIV. You should then be tested every 3 months for as long as you are taking PrEP. Pregnancy  If you are premenopausal and you may become pregnant, ask your health care provider about preconception counseling.  If you may become  pregnant, take 400 to 800 micrograms (mcg) of folic acid every day.  If you want to prevent pregnancy, talk to your health care provider about birth control (contraception). Osteoporosis and menopause  Osteoporosis is a disease in which the bones lose minerals and strength with aging. This can result in serious bone fractures. Your risk for osteoporosis can be identified using a bone density scan.  If you are 76 years of age or older, or if you are at risk for osteoporosis and fractures, ask your health care provider if you should be screened.  Ask your health care provider whether you should take a calcium or vitamin D supplement to lower your risk for osteoporosis.  Menopause may have certain physical symptoms and risks.  Hormone replacement therapy may reduce some of these symptoms and risks. Talk to your health care provider about whether hormone replacement therapy is right for you. Follow these instructions at home:  Schedule regular health, dental, and eye exams.  Stay current with your immunizations.  Do not use any tobacco products including cigarettes, chewing tobacco, or electronic cigarettes.  If you are pregnant, do not drink alcohol.  If you are breastfeeding, limit how much and how often you drink alcohol.  Limit alcohol intake to no more than 1 drink per day for nonpregnant women. One drink equals 12 ounces of beer, 5 ounces of wine, or 1 ounces of hard liquor.  Do not use street drugs.  Do not share needles.  Ask your health care  provider for help if you need support or information about quitting drugs.  Tell your health care provider if you often feel depressed.  Tell your health care provider if you have ever been abused or do not feel safe at home. This information is not intended to replace advice given to you by your health care provider. Make sure you discuss any questions you have with your health care provider. Document Released: 08/05/2010 Document Revised: 06/28/2015 Document Reviewed: 10/24/2014 Elsevier Interactive Patient Education  2018 [employer]. Osteoporosis Osteoporosis happens when your bones become thinner and weaker. Weak bones can break (fracture) more easily when you slip or fall. Bones most at risk of breaking are in the hip, wrist, and spine. Follow these instructions at home:  Get enough calcium and vitamin D. These nutrients are good for your bones.  Exercise as told by your doctor.  Do not use any tobacco products. This includes cigarettes, chewing tobacco, and electronic cigarettes. If you need help quitting, ask your doctor.  Limit the amount of alcohol you drink.  Take medicines only as told by your doctor.  Keep all follow-up visits as told by your doctor. This is important.  Take care at home to prevent falls. Some ways to do this are: ? Keep rooms well lit and tidy. ? Put safety rails on your stairs. ? Put a rubber mat in the bathroom and other places that are often wet or slippery. Get help right away if:  You fall.  You hurt yourself. This information is not intended to replace advice given to you by your health care provider. Make sure you discuss any questions you have with your health care provider. Document Released: 04/14/2011 Document Revised: 06/28/2015 Document Reviewed: 06/30/2013 Elsevier Interactive Patient Education  [employer].

## 2017-08-18 LAB — PAP IG W/ RFLX HPV ASCU

## 2017-08-20 ENCOUNTER — Ambulatory Visit: Payer: Managed Care, Other (non HMO)

## 2017-08-21 NOTE — Telephone Encounter (Signed)
Prolia Given 08/17/17 Next injection 02/18/2018

## 2017-11-10 ENCOUNTER — Telehealth: Payer: Self-pay | Admitting: *Deleted

## 2017-11-10 NOTE — Telephone Encounter (Addendum)
Pt left voice mail pertaining to her Prolia last injection as 08/17/17 . Left voice mail to call me back with questions she has pertaining to this medication.

## 2017-11-18 ENCOUNTER — Telehealth: Payer: Self-pay | Admitting: *Deleted

## 2017-11-18 NOTE — Telephone Encounter (Signed)
-----   Message from Alen Blew, [place] sent at 11/18/2017  3:29 PM EDT ----- Regarding: RE: VM on Prolia Contact: [phone] Can u follow up with this patient ans see if she reenrolled in the [place] card program.  I asked her to do so several weeks ago.   Each pt needs to reenroll every year, even though the card has an expiration date of 2-3 years  thanks ----- Message ----- From: Gorden Harms Sent: 11/09/2017   8:48 AM EDT To: Alen Blew, CMA, Rayah Fines, RMA Subject: VM on Prolia                                   FYI...Marland KitchenMarland KitchenMarland Kitchen  This patient LM at front desk and said it was for "[place]" So I'm not sure if she meant Sharrie Rothman :)  Anyway she said she will have to get back w/you once she gathers her information about her "Prolia Card"?  Thanks :)

## 2018-02-05 ENCOUNTER — Telehealth: Payer: Self-pay | Admitting: *Deleted

## 2018-02-05 NOTE — Telephone Encounter (Signed)
Prolia insurance verification has been sent awaiting Summary of benefits  

## 2018-02-11 NOTE — Telephone Encounter (Signed)
Received Policy Termination letter Called pt left message Need new insurance info

## 2018-03-12 NOTE — Telephone Encounter (Signed)
Pt states she will bring in new insurance card for [place] to make a copy and run benefits again

## 2018-04-12 DIAGNOSIS — Z79899 Other long term (current) drug therapy: Secondary | ICD-10-CM | POA: Diagnosis not present

## 2018-04-12 DIAGNOSIS — M1712 Unilateral primary osteoarthritis, left knee: Secondary | ICD-10-CM | POA: Diagnosis not present

## 2018-04-12 DIAGNOSIS — E785 Hyperlipidemia, unspecified: Secondary | ICD-10-CM | POA: Diagnosis not present

## 2018-04-12 DIAGNOSIS — E039 Hypothyroidism, unspecified: Secondary | ICD-10-CM | POA: Diagnosis not present

## 2018-04-22 DIAGNOSIS — M1712 Unilateral primary osteoarthritis, left knee: Secondary | ICD-10-CM | POA: Diagnosis not present

## 2018-07-05 NOTE — Telephone Encounter (Signed)
New insurance card given today   [place] verification has been sent awaiting Summary of benefits

## 2018-07-06 NOTE — Telephone Encounter (Addendum)
Deductible Amount met   OOP MAX $3600 (16mt)  Annual exam  07/28/2018 [place]  Calcium 10.3       Date 04/13/18  Upcoming dental procedures   Prior Authorization needed NO  Pt estimated Cost $243   Prolia same day as annual    Coverage Details:20% one dose, $30 admin fee

## 2018-07-26 DIAGNOSIS — M1711 Unilateral primary osteoarthritis, right knee: Secondary | ICD-10-CM | POA: Diagnosis not present

## 2018-07-27 ENCOUNTER — Other Ambulatory Visit: Payer: Self-pay

## 2018-07-27 ENCOUNTER — Encounter: Payer: Self-pay | Admitting: Women's Health

## 2018-07-27 ENCOUNTER — Ambulatory Visit (INDEPENDENT_AMBULATORY_CARE_PROVIDER_SITE_OTHER): Payer: Medicare Other | Admitting: Women's Health

## 2018-07-27 VITALS — BP 134/88 | Ht 58.5 in | Wt 131.2 lb

## 2018-07-27 DIAGNOSIS — Z01419 Encounter for gynecological examination (general) (routine) without abnormal findings: Secondary | ICD-10-CM | POA: Diagnosis not present

## 2018-07-27 DIAGNOSIS — M810 Age-related osteoporosis without current pathological fracture: Secondary | ICD-10-CM

## 2018-07-27 MED ORDER — DENOSUMAB 60 MG/ML ~~LOC~~ SOSY
60.0000 mg | PREFILLED_SYRINGE | Freq: Once | SUBCUTANEOUS | Status: AC
  Administered 2018-07-27: 60 mg via SUBCUTANEOUS

## 2018-07-27 MED ORDER — BETAMETHASONE VALERATE 0.1 % EX CREA: TOPICAL_CREAM | Freq: Two times a day (BID) | CUTANEOUS | 0 refills | Status: AC

## 2018-07-27 NOTE — Progress Notes (Signed)
Janice [name] 04/03/1950 [id number]    History:    Presents for breast and pelvic exam.  2005 endometrial cancer grade 1 TAH with BSO with normal Paps after.  Normal Paps prior.  No HRT.  Normal mammogram history.  2016- colonoscopy.  Osteoporosis on Prolia started in 2016,  2018 T score -2.7 stable with some improvement.  Primary care manages hypercholesteremia, GERD, anxiety and depression.  Had Shingrex not Pneumovax.  Not sexually active, years . Past medical history, past surgical history, family history and social history were all reviewed and documented in the EPIC chart.  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  ROS:  A ROS was performed and pertinent positives and negatives are included.  Exam:  Vitals:   07/27/18 1616  BP: 134/88  Weight: 131 lb 3.2 oz (59.5 kg)  Height: 4' 10.5" (1.486 m)   Body mass index is 26.95 kg/m.   General appearance:  Normal Thyroid:  Symmetrical, normal in size, without palpable masses or nodularity. Respiratory  Auscultation:  Clear without wheezing or rhonchi Cardiovascular  Auscultation:  Regular rate, without rubs, murmurs or gallops  Edema/varicosities:  Not grossly evident Abdominal  Soft,nontender, without masses, guarding or rebound.  Liver/spleen:  No organomegaly noted  Hernia:  None appreciated  Skin  Inspection:  Grossly normal   Breasts: Examined lying and sitting.     Right: Without masses, retractions, discharge or axillary adenopathy.     Left: Without masses, retractions, discharge or axillary adenopathy. Gentitourinary   Inguinal/mons:  Normal without inguinal adenopathy  External genitalia:  Normal  BUS/Urethra/Skene's glands:  Normal  Vagina:  Normal  Cervix: And uterus absent  Adnexa/parametria:     Rt: Without masses or tenderness.   Lt: Without masses or tenderness.  Anus and perineum: Normal  Digital rectal exam: Normal sphincter tone without palpated masses or tenderness  Assessment/Plan:  68 y.o.  S WF G1 P0 for breast and pelvic exam with no complaints.  2005 TAH with BSO for grade 1 endometrial cancer on no HRT Hypercholesteremia, anxiety/depression-primary care manages labs and meds Osteoporosis on Prolia since 10/2014   Plan: Continue Prolia 60 mg every 6 months.  Repeat DEXA, will schedule.  reviewed importance of weightbearing and balance type exercise, encouraged yoga.  Home safety, fall prevention discussed.  Vitamin D 2000 daily calcium rich foods encouraged.  SBEs, overdue for mammogram instructed to schedule.  Pneumovax reviewed will get at primary care.  Pap normal 2019, new screening guidelines reviewed.    [place], 4:56 PM 07/27/2018

## 2018-07-27 NOTE — Patient Instructions (Signed)

## 2018-07-28 ENCOUNTER — Encounter: Payer: Managed Care, Other (non HMO) | Admitting: Women's Health

## 2018-08-02 DIAGNOSIS — D225 Melanocytic nevi of trunk: Secondary | ICD-10-CM | POA: Diagnosis not present

## 2018-08-02 DIAGNOSIS — L821 Other seborrheic keratosis: Secondary | ICD-10-CM | POA: Diagnosis not present

## 2018-08-02 DIAGNOSIS — D2239 Melanocytic nevi of other parts of face: Secondary | ICD-10-CM | POA: Diagnosis not present

## 2018-08-02 DIAGNOSIS — D1801 Hemangioma of skin and subcutaneous tissue: Secondary | ICD-10-CM | POA: Diagnosis not present

## 2018-08-03 DIAGNOSIS — M5136 Other intervertebral disc degeneration, lumbar region: Secondary | ICD-10-CM | POA: Diagnosis not present

## 2018-08-11 ENCOUNTER — Other Ambulatory Visit: Payer: Self-pay

## 2018-08-17 DIAGNOSIS — M545 Low back pain: Secondary | ICD-10-CM | POA: Diagnosis not present

## 2018-08-17 DIAGNOSIS — M5136 Other intervertebral disc degeneration, lumbar region: Secondary | ICD-10-CM | POA: Diagnosis not present

## 2018-08-19 DIAGNOSIS — M5136 Other intervertebral disc degeneration, lumbar region: Secondary | ICD-10-CM | POA: Diagnosis not present

## 2018-08-23 ENCOUNTER — Encounter: Payer: Managed Care, Other (non HMO) | Admitting: Women's Health

## 2018-09-02 NOTE — Telephone Encounter (Signed)
PROLIA GIVEN 07/27/2018 NEXT INJECTION 01/27/2019

## 2018-09-10 DIAGNOSIS — M5416 Radiculopathy, lumbar region: Secondary | ICD-10-CM | POA: Diagnosis not present

## 2018-09-10 DIAGNOSIS — M4316 Spondylolisthesis, lumbar region: Secondary | ICD-10-CM | POA: Diagnosis not present

## 2018-09-27 DIAGNOSIS — M1712 Unilateral primary osteoarthritis, left knee: Secondary | ICD-10-CM | POA: Diagnosis not present

## 2018-09-27 DIAGNOSIS — M48062 Spinal stenosis, lumbar region with neurogenic claudication: Secondary | ICD-10-CM | POA: Diagnosis not present

## 2018-09-27 DIAGNOSIS — Z6824 Body mass index (BMI) 24.0-24.9, adult: Secondary | ICD-10-CM | POA: Diagnosis not present

## 2018-11-01 DIAGNOSIS — R030 Elevated blood-pressure reading, without diagnosis of hypertension: Secondary | ICD-10-CM | POA: Diagnosis not present

## 2018-11-01 DIAGNOSIS — M4316 Spondylolisthesis, lumbar region: Secondary | ICD-10-CM | POA: Diagnosis not present

## 2018-11-01 DIAGNOSIS — M48062 Spinal stenosis, lumbar region with neurogenic claudication: Secondary | ICD-10-CM | POA: Diagnosis not present

## 2018-11-10 ENCOUNTER — Encounter: Payer: Self-pay | Admitting: Gynecology

## 2018-12-07 ENCOUNTER — Other Ambulatory Visit: Payer: Self-pay

## 2018-12-08 ENCOUNTER — Other Ambulatory Visit: Payer: Self-pay | Admitting: Women's Health

## 2018-12-08 ENCOUNTER — Ambulatory Visit (INDEPENDENT_AMBULATORY_CARE_PROVIDER_SITE_OTHER): Payer: Medicare Other

## 2018-12-08 DIAGNOSIS — Z780 Asymptomatic menopausal state: Secondary | ICD-10-CM

## 2018-12-08 DIAGNOSIS — M810 Age-related osteoporosis without current pathological fracture: Secondary | ICD-10-CM | POA: Diagnosis not present

## 2018-12-15 DIAGNOSIS — E785 Hyperlipidemia, unspecified: Secondary | ICD-10-CM | POA: Diagnosis not present

## 2018-12-15 DIAGNOSIS — M48062 Spinal stenosis, lumbar region with neurogenic claudication: Secondary | ICD-10-CM | POA: Diagnosis not present

## 2018-12-15 DIAGNOSIS — Z79899 Other long term (current) drug therapy: Secondary | ICD-10-CM | POA: Diagnosis not present

## 2018-12-15 DIAGNOSIS — E538 Deficiency of other specified B group vitamins: Secondary | ICD-10-CM | POA: Diagnosis not present

## 2018-12-15 DIAGNOSIS — Z131 Encounter for screening for diabetes mellitus: Secondary | ICD-10-CM | POA: Diagnosis not present

## 2018-12-15 DIAGNOSIS — E039 Hypothyroidism, unspecified: Secondary | ICD-10-CM | POA: Diagnosis not present

## 2018-12-27 ENCOUNTER — Other Ambulatory Visit: Payer: Self-pay

## 2018-12-28 ENCOUNTER — Telehealth: Payer: Self-pay | Admitting: *Deleted

## 2018-12-28 ENCOUNTER — Encounter: Payer: Self-pay | Admitting: Obstetrics & Gynecology

## 2018-12-28 ENCOUNTER — Ambulatory Visit: Payer: Medicare Other | Admitting: Obstetrics & Gynecology

## 2018-12-28 VITALS — BP 136/84

## 2018-12-28 DIAGNOSIS — M810 Age-related osteoporosis without current pathological fracture: Secondary | ICD-10-CM

## 2018-12-28 DIAGNOSIS — Z8781 Personal history of (healed) traumatic fracture: Secondary | ICD-10-CM | POA: Diagnosis not present

## 2018-12-28 NOTE — Progress Notes (Signed)
    Janice [name] [birth year] [id number]        68 y.o.  G1P0010 Single  RP: Counseling on Osteoporosis with vertebral fracture on Prolia  HPI: On Prolia x 2016.  Vertebral fracture within the last 2 yrs per radiologist/ortho assessment.  Last BD showing significant decrease in BD at all sites measured.  S/P TAH/BSO in 2005 for Endometrial Ca Stage 1/Grade 1.  No HRT.  On Vit D supplements/Ca++ 1200 mg daily and exercising regularly, although limited by bilateral knee replacement planned and Vertebral fracture causing neurologic pain.   OB History  Gravida Para Term Preterm AB Living  1       1 0  SAB TAB Ectopic Multiple Live Births               # Outcome Date GA Lbr Len/2nd Weight Sex Delivery Anes PTL Lv  1 AB             Past medical history,surgical history, problem list, medications, allergies, family history and social history were all reviewed and documented in the EPIC chart.   Directed ROS with pertinent positives and negatives documented in the history of present illness/assessment and plan.  Exam:  There were no vitals filed for this visit. General appearance:  Normal  Bone Density 12/08/2018:  Osteoporosis at the RFN T-Score -2.7 and LFN T-Score -2.9.  Significant decrease in BD at the Spine/Total Rt and Lt Hips.  L3-L4 excluded because of vertebral fracture at that level.  MRI and X-Ray of Lumbar spine showed a compressive fracture at L3-4 which probably happened within 2 yrs.   Assessment/Plan:  68 y.o. G1P0010   1. Age-related osteoporosis without current pathological fracture Osteoporosis with decrease in BD at all sites on Prolia x 2016 and a Vertebral compression fracture at L3-4 within the last 2 yrs.  Decision to change treatment to Reclast infusions.  Reclast treatment discussed, risks and benefits reviewed.  Will continue on Vit D supplements, Ca++ 1200 mg daily and regular weightbearing physical activities.  2. History of vertebral fracture Compressive  L3-4 fracture within 2 yrs with neurologic symptoms.    Other orders - diclofenac (VOLTAREN) 75 MG EC tablet; Take 75 mg by mouth 2 (two) times daily. - ARIPiprazole (ABILIFY) 2 MG tablet; Take 2 mg by mouth daily.  Counseling on above issues and coordination of care >50% x 25 minutes.  Princess Bruins MD, 8:25 AM 12/28/2018

## 2018-12-28 NOTE — Telephone Encounter (Signed)
-----   Message from Princess Bruins, MD sent at 12/28/2018  8:57 AM EST ----- Regarding: Reclast treatment to start Osteoporosis worsening on Prolia with a vertebral fracture.  Stop Prolia and start Reclast.  Please organize and call patient with date of Reclast infusion.

## 2018-12-28 NOTE — Patient Instructions (Signed)
1. Age-related osteoporosis without current pathological fracture Osteoporosis with decrease in BD at all sites on Prolia x 2016 and a Vertebral compression fracture at L3-4 within the last 2 yrs.  Decision to change treatment to Reclast infusions.  Reclast treatment discussed, risks and benefits reviewed.  Will continue on Vit D supplements, Ca++ 1200 mg daily and regular weightbearing physical activities.  2. History of vertebral fracture Compressive L3-4 fracture within 2 yrs with neurologic symptoms.    Other orders - diclofenac (VOLTAREN) 75 MG EC tablet; Take 75 mg by mouth 2 (two) times daily. - ARIPiprazole (ABILIFY) 2 MG tablet; Take 2 mg by mouth daily.  [place], it was a pleasure meeting you today!  You will receive a phone call to organize your first Reclast infusion.

## 2019-01-04 NOTE — Telephone Encounter (Signed)
Spoke with pt about Reclast she stated she will call me back in regards to lab appt to proceed with Reclast. Will give pt a couple days to call me back.

## 2019-01-04 NOTE — Telephone Encounter (Addendum)
Reclast instructions  Annual Exam (1 year) ? 07/27/2018 [place]  Called pt to verify insurance coverage / inform pt Reclast is in Process ? YES  Labs must be in 30 days window Serum Creatinine 0.68  DATE 01/20/2019 Serum Calcium 9.5  DATE 01/20/2019   PA needed? NO  Cost for Pt? $334  Pt is responsible for 20% co insurance reference [id number] No ded  OOP $3600 ($755.88MET)  Order form filled out and faxed  w/MD sig?  Infusion will be done at [facility]  Pt aware?  Instructions mailed to pt?

## 2019-01-17 DIAGNOSIS — E538 Deficiency of other specified B group vitamins: Secondary | ICD-10-CM | POA: Diagnosis not present

## 2019-01-19 NOTE — Telephone Encounter (Signed)
Will close encounter awaiting  pt call

## 2019-01-20 ENCOUNTER — Other Ambulatory Visit: Payer: Self-pay

## 2019-01-20 ENCOUNTER — Other Ambulatory Visit: Payer: Medicare Other

## 2019-01-20 DIAGNOSIS — M810 Age-related osteoporosis without current pathological fracture: Secondary | ICD-10-CM | POA: Diagnosis not present

## 2019-01-20 NOTE — Telephone Encounter (Addendum)
Pt called back to schedule labs for Reclast 01/20/2019 Once labs come back normal range we will proceed with scheduling Reclast order in system.

## 2019-01-20 NOTE — Addendum Note (Signed)
Addended by: Lorine Bears on: 01/20/2019 12:01 PM   Modules accepted: Orders

## 2019-01-21 LAB — CREATININE, SERUM: Creat: 0.68 mg/dL (ref 0.50–0.99)

## 2019-01-21 LAB — CALCIUM: Calcium: 9.5 mg/dL (ref 8.6–10.4)

## 2019-01-21 NOTE — Telephone Encounter (Signed)
Called pt to set up Reclast per pt she would like to wait till 1st of year to see if there will be any difference in co pay. Will call insurance company in Jan

## 2019-02-14 NOTE — Telephone Encounter (Signed)
Ran summary of benefits for 2021 see below:  For [id number] 80/20= $265 Admin fee of 295 per [facility] with total of 560

## 2019-02-17 ENCOUNTER — Telehealth: Payer: Self-pay | Admitting: *Deleted

## 2019-02-17 DIAGNOSIS — M810 Age-related osteoporosis without current pathological fracture: Secondary | ICD-10-CM

## 2019-02-17 NOTE — Telephone Encounter (Signed)
Co pay to expensive for Reclast . Pt request an alternative for treatment of osteoporosis. Will route to Dr. Dellis Filbert for advice

## 2019-02-17 NOTE — Telephone Encounter (Signed)
-----   Message from Alen Blew, [place] sent at 02/15/2019  8:50 AM EST ----- What I could see online is that she would have a facility fee of $195.00 for the infusion then would have to pay 20% of Reclast allowable.   I hope this helps.   Thanks Sharrie Rothman ----- Message ----- From: Enrigue Catena, RMA Sent: 02/14/2019   3:42 PM EST To: Alen Blew, CMA  Reclast coverage

## 2019-02-17 NOTE — Telephone Encounter (Signed)
Recast instructions  Annual Exam (1 year) ? 07/27/2018  Called pt to verify insurance coverage / inform pt Reclast is in Process ?  Labs must be in 30 days window Serum Creatinine  0.68    DATE? 01/20/2019 Serum Calcium 9.5     DATE? 01/20/2019   PA needed? NO  Cost for Pt? $460  Order form filled out and faxed  w/MD sig?  Infusion will be done at ?  Pt aware?  Instructions mailed to pt?

## 2019-02-18 NOTE — Addendum Note (Signed)
Addended by: Lorine Bears on: 02/18/2019 02:11 PM   Modules accepted: Orders

## 2019-02-18 NOTE — Telephone Encounter (Signed)
Pt does not want to try Boniva prefers to discuss with endocrinologist. Will send referral to [place].

## 2019-02-18 NOTE — Telephone Encounter (Signed)
Pt does not want to try Boniva prefers to discuss with endocrinologist. Will  send referral to [place].       10:31 AM You routed this conversation  8:41 AM Princess Bruins, MD routed this conversation to Me  Princess Bruins, MD     8:41 AM Note   Recommend starting on Boniva 150 mg 1 tab PO monthly.  And refer to Endocrinologist for management of Osteoporosis.

## 2019-02-18 NOTE — Telephone Encounter (Signed)
Recommend starting on Boniva 150 mg 1 tab PO monthly.  And refer to Endocrinologist for management of Osteoporosis.

## 2019-02-28 ENCOUNTER — Other Ambulatory Visit: Payer: Self-pay

## 2019-03-02 ENCOUNTER — Encounter: Payer: Self-pay | Admitting: Internal Medicine

## 2019-03-02 ENCOUNTER — Ambulatory Visit: Payer: Medicare Other | Admitting: Internal Medicine

## 2019-03-02 ENCOUNTER — Other Ambulatory Visit: Payer: Self-pay

## 2019-03-02 VITALS — BP 138/78 | HR 87 | Temp 98.4°F | Ht 60.0 in | Wt 145.2 lb

## 2019-03-02 DIAGNOSIS — M810 Age-related osteoporosis without current pathological fracture: Secondary | ICD-10-CM

## 2019-03-02 LAB — BASIC METABOLIC PANEL
BUN: 21 mg/dL (ref 6–23)
CO2: 30 mEq/L (ref 19–32)
Calcium: 10.2 mg/dL (ref 8.4–10.5)
Chloride: 104 mEq/L (ref 96–112)
Creatinine, Ser: 0.64 mg/dL (ref 0.40–1.20)
GFR: 92.73 mL/min (ref >60.00)
Glucose, Bld: 75 mg/dL (ref 70–99)
Potassium: 4.1 mEq/L (ref 3.5–5.1)
Sodium: 140 mEq/L (ref 135–145)

## 2019-03-02 LAB — VITAMIN D 25 HYDROXY (VIT D DEFICIENCY, FRACTURES): VITD: 49.2 ng/mL (ref 30.00–100.00)

## 2019-03-02 NOTE — Progress Notes (Signed)
Name: Janice [name]  MRN/ DOB: [id number], 10/26/1950    Age/ Sex: 69 y.o., female    PCP: Janice Kiel, MD   Reason for Endocrinology Evaluation: Osteoporosis     Date of Initial Endocrinology Evaluation: 03/02/2019     HPI: Janice [name] is a 69 y.o. female with a past medical history of  Endometria cancer in 2005 (S/P TAH and BSO) and dyslipidemia . The patient presented for initial endocrinology clinic visit on 03/02/2019 for consultative assistance with her Osteoporosis.   Pt was diagnosed with osteoporosis: 2008  Menarche at age : 71 Menopausal at age : surgical at age 40 Fracture Hx: Vertebral factures Compressive L3-L4 Hx of HRT: No  FH of osteoporosis or hip fracture: unknown  Prior Hx of anti-estrogenic therapy : no  Prior Hx of anti-resorptive therapy : Prolia started in 10/2014. Prior to 2016 she tried oral bisphosphonates but did not feel good on them, she was offered Reclast in 2013 but declined.  Boniva started 02/17/2019 but she did not pick it up   She received the following Prolia injections 10/2014,05/2015,12/2015,06/2016,12/18, 08/2017, 07/2018   Caltrate 600 mg once daily  Vitamin D 2000 iu daily    Has chronic back and hip issues, follows with chiropracter   HISTORY:  Past Medical History:  Past Medical History:  Diagnosis Date  . Allergy   . Anemia   . Anxiety   . Arthritis   . Atrophic vaginitis   . Depression   . Elevated cholesterol   . Endometrial cancer ([place])    Well diff. Grade I StageI  . Endometriosis   . GERD (gastroesophageal reflux disease)   . Osteoporosis   . Seizures ([place])    x1   . Thyroid disease    hypoactive  . Vitamin D deficiency    Past Surgical History:  Past Surgical History:  Procedure Laterality Date  . Abdominal cyst removed    . ABDOMINAL HYSTERECTOMY  2005   TAH BSO  . BUNIONECTOMY    . COLONOSCOPY    . OOPHORECTOMY     BSO  . PELVIC LAPAROSCOPY     DL-Laser endometriosis  . TONSILLECTOMY    .  TONSILLECTOMY        Social History:  reports that she has never smoked. She has never used smokeless tobacco. She reports current alcohol use. She reports that she does not use drugs.  Family History: family history includes Heart disease in her father; Hyperlipidemia in her father; Hypertension in her father; Stomach cancer in her paternal grandfather.   HOME MEDICATIONS: Allergies as of 03/02/2019      Reactions   Erythromycin Nausea And Vomiting   Sulfa Antibiotics Nausea And Vomiting      Medication List       Accurate as of March 02, 2019 11:21 AM. If you have any questions, ask your nurse or doctor.        ARIPiprazole 2 MG tablet Commonly known as: ABILIFY Take 2 mg by mouth daily.   betamethasone valerate 0.1 % cream Commonly known as: VALISONE Apply topically 2 (two) times daily.   buPROPion 150 MG 24 hr tablet Commonly known as: WELLBUTRIN XL Take 150 mg by mouth daily.   CALCIUM + D PO Take by mouth.   cholecalciferol 1000 units tablet Commonly known as: VITAMIN D Take 1,000 Units by mouth daily.   denosumab 60 MG/ML Soln injection Commonly known as: PROLIA Inject 60 mg into the skin every  6 (six) months. Administer in upper arm, thigh, or abdomen   desvenlafaxine 50 MG 24 hr tablet Commonly known as: PRISTIQ Take 50 mg by mouth daily.   diclofenac 75 MG EC tablet Commonly known as: VOLTAREN Take 75 mg by mouth 2 (two) times daily.   dicyclomine 10 MG capsule Commonly known as: Bentyl Take 1 capsule (10 mg total) by mouth 4 (four) times daily -  before meals and at bedtime.   ibandronate 150 MG tablet Commonly known as: BONIVA Take 150 mg by mouth every 30 (thirty) days. Take in the morning with a full glass of water, on an empty stomach, and do not take anything else by mouth or lie down for the next 30 min.   levothyroxine 88 MCG tablet Commonly known as: SYNTHROID Take 88 mcg by mouth daily.   pravastatin 20 MG tablet Commonly known  as: PRAVACHOL Take 20 mg by mouth daily.   traZODone 50 MG tablet Commonly known as: DESYREL as needed.         REVIEW OF SYSTEMS: A comprehensive ROS was conducted with the patient and is negative except as per HPI and below:  Review of Systems  Gastrointestinal: Negative for diarrhea and nausea.  Musculoskeletal: Positive for joint pain.  Neurological: Negative for tingling and tremors.       OBJECTIVE:  VS: BP 138/78 (BP Location: Left Arm, Patient Position: Sitting, Cuff Size: Large)   Pulse 87   Temp 98.4 F (36.9 C)   Ht 5' (1.524 m)   Wt 145 lb 3.2 oz (65.9 kg)   SpO2 97%   BMI 28.36 kg/m    Wt Readings from Last 3 Encounters:  03/02/19 145 lb 3.2 oz (65.9 kg)  07/27/18 131 lb 3.2 oz (59.5 kg)  08/17/17 129 lb (58.5 kg)     EXAM: General: Pt appears well and is in NAD  Eyes: External eye exam normal without stare, lid lag or exophthalmos.  EOM intact.  Neck: General: Supple without adenopathy. Thyroid: Thyroid size normal.  No goiter or nodules appreciated. No thyroid bruit.  Lungs: Clear with good BS bilat with no rales, rhonchi, or wheezes  Heart: Auscultation: RRR.  Abdomen: Normoactive bowel sounds, soft, nontender, without masses or organomegaly palpable  Extremities:  BL LE: No pretibial edema normal ROM and strength.  Neuro: Cranial nerves: II - XII grossly intact  Motor: Normal strength throughout DTRs: 2+ and symmetric in UE without delay in relaxation phase  Mental Status: Judgment, insight: Intact Orientation: Oriented to time, place, and person Mood and affect: No depression, anxiety, or agitation     DATA REVIEWED: Results for [name], [name] (MRN [id number]) as of 03/03/2019 07:41  Ref. Range 03/02/2019 11:50  Sodium Latest Ref Range: 135 - 145 mEq/L 140  Potassium Latest Ref Range: 3.5 - 5.1 mEq/L 4.1  Chloride Latest Ref Range: 96 - 112 mEq/L 104  CO2 Latest Ref Range: 19 - 32 mEq/L 30  Glucose Latest Ref Range: 70 - 99 mg/dL 75    BUN Latest Ref Range: 6 - 23 mg/dL 21  Creatinine Latest Ref Range: 0.40 - 1.20 mg/dL 0.64  Calcium Latest Ref Range: 8.4 - 10.5 mg/dL 10.2  GFR Latest Ref Range: >60.00 mL/min 92.73  VITD Latest Ref Range: 30.00 - 100.00 ng/mL 49.20   12/15/2018 TSH 1.4 uIU/mL     2008 2016 Change from previous  2018 Change from previous  2020 Change from previous  AP Spine -1.6 -2.3 -10.1 -1.6 +9.6%* -0.9 -  7.4%*  LFN -3.1 -2.9  -2.7  -2.9   Left total hip -1.8 -2.1 -5.1*% -1.7 +7.3%* -2.1 -7.8%*  RFN -2.7 -2.8  -2.6  -2.7   Right total hip -1.6 -2.2 -9.4*% -1.9 +5.2%* -2.3 -6.8%*  * significant   ASSESSMENT/PLAN/RECOMMENDATIONS:   1. Osteoporosis :  -  Multifactorial given late menarche, early menopause and insufficient calcium intake.  - Today I have reviewed her DXA scan from 2008 until 2020. Her BMD has improved significantly at the spine and hips within 2 years of starting prolia, unfortunately there was an ~ 1 year of interruption of Prolia from 08/2017 until 07/2018 which results in loss of BMD over that period of time. This is due to the rebound resorption with prolia if not followed immediately by a bisphosphonate.  - We did discuss various treatment options , we discussed that after 3 years of injectable antiresorptive treatment, it is recommended to consider a drug holiday but given the above, continuing with anti-resorptive therapy outweighs the risk.  - Will start her on reclast - We discussed rare side effect of atypical fractures and osteonecrosis. Pt encouraged to have an updated dental exam - She has a Hx of compressive vertebral fractures, but its unclear how long has this been going on. I would NOT consider this finding as failure of therapy at this time.   Medications : Increase Caltrate 600 mg to BID  Continue Vitamin D3 2000 iu daily   Reclast - a message was sent to Janice [name] - Pt will be contacted after this has been sorted out through insurance    Pt expressed  understanding    F/U in 1 yr    Signed electronically by: Mack Guise, MD  [facility] Endocrinology  [facility] [address] [place], [place] [address] Phone: [phone] FAX: [phone]   CC: Janice Kiel, MD [place] [place] [address] Phone: [phone] Fax: [phone]   Return to Endocrinology clinic as below: Future Appointments  Date Time Provider [place]  08/02/2019  8:30 AM Huel Cote, NP [facility] Mariane Baumgarten

## 2019-03-02 NOTE — Patient Instructions (Signed)
-   Caltrate 600 mg TWICE A DAY  - Continue Vitamin D3 2000 iu daily

## 2019-03-03 ENCOUNTER — Encounter: Payer: Self-pay | Admitting: Internal Medicine

## 2019-03-03 MED ORDER — CALCIUM CARBONATE 1500 (600 CA) MG PO TABS: 600.0000 mg | ORAL_TABLET | Freq: Two times a day (BID) | ORAL | 12 refills | Status: AC

## 2019-03-22 ENCOUNTER — Telehealth: Payer: Self-pay | Admitting: Nutrition

## 2019-03-22 NOTE — Telephone Encounter (Signed)
Message left to call me to schedule a reclast infusion.  Telephone number given

## 2019-04-01 DIAGNOSIS — E039 Hypothyroidism, unspecified: Secondary | ICD-10-CM | POA: Diagnosis not present

## 2019-04-01 DIAGNOSIS — M48062 Spinal stenosis, lumbar region with neurogenic claudication: Secondary | ICD-10-CM | POA: Diagnosis not present

## 2019-04-01 DIAGNOSIS — E785 Hyperlipidemia, unspecified: Secondary | ICD-10-CM | POA: Diagnosis not present

## 2019-05-03 DIAGNOSIS — R030 Elevated blood-pressure reading, without diagnosis of hypertension: Secondary | ICD-10-CM | POA: Diagnosis not present

## 2019-05-03 DIAGNOSIS — M48062 Spinal stenosis, lumbar region with neurogenic claudication: Secondary | ICD-10-CM | POA: Diagnosis not present

## 2019-05-03 DIAGNOSIS — M4316 Spondylolisthesis, lumbar region: Secondary | ICD-10-CM | POA: Diagnosis not present

## 2019-05-26 ENCOUNTER — Telehealth: Payer: Self-pay

## 2019-05-26 NOTE — Telephone Encounter (Signed)
Reference number for verification: [id number]

## 2019-05-26 NOTE — Telephone Encounter (Signed)
Called pt's insurance and verified coverage for Reclast infusion, and corresponding charges with Reclast infusion. Reclast is covered with no PA needed. Patient is responsible for 20% coinsurance and owes $30 copay for the infusion process.

## 2019-06-15 DIAGNOSIS — Z79899 Other long term (current) drug therapy: Secondary | ICD-10-CM | POA: Diagnosis not present

## 2019-06-15 DIAGNOSIS — E538 Deficiency of other specified B group vitamins: Secondary | ICD-10-CM | POA: Diagnosis not present

## 2019-06-15 DIAGNOSIS — E785 Hyperlipidemia, unspecified: Secondary | ICD-10-CM | POA: Diagnosis not present

## 2019-06-15 DIAGNOSIS — E039 Hypothyroidism, unspecified: Secondary | ICD-10-CM | POA: Diagnosis not present

## 2019-06-15 DIAGNOSIS — Z0000 Encounter for general adult medical examination without abnormal findings: Secondary | ICD-10-CM | POA: Diagnosis not present

## 2019-06-15 DIAGNOSIS — Z7189 Other specified counseling: Secondary | ICD-10-CM | POA: Diagnosis not present

## 2019-07-05 NOTE — Telephone Encounter (Signed)
Attempted to call and schedule pt for Reclast infusion. Pt did not answer the phone and voicemail was left requesting a call back. Pt's financial responsibility is approx $340.00. Insurance should pay the rest according to information below. Will attempt to contact pt again.

## 2019-07-14 ENCOUNTER — Other Ambulatory Visit: Payer: Self-pay | Admitting: Internal Medicine

## 2019-07-14 ENCOUNTER — Telehealth: Payer: Self-pay | Admitting: Endocrinology

## 2019-07-14 DIAGNOSIS — M810 Age-related osteoporosis without current pathological fracture: Secondary | ICD-10-CM

## 2019-07-14 NOTE — Telephone Encounter (Signed)
Returned pt's phone call. She was informed that she does need blood work, and she was okay with this. Pt will be coming tomorrow for blood work. Lab schedule is currently blocked off, but pt was informed that she could still come and I would make sure she was placed on the schedule if need be.

## 2019-07-14 NOTE — Telephone Encounter (Signed)
Patient had a question about her injection - would like Olen Cordial to call her at [phone]

## 2019-07-14 NOTE — Telephone Encounter (Signed)
Reclast order form requires that pt have bloodwork done within 30 days of infusion. Please order any pertinent bloodwork needed.   Also contacted pt to advise her that she does need to come to have labs today in order to proceed with infusion tomorrow. Left detailed voicemail with this information, and currently waiting on a callback from pt.

## 2019-07-14 NOTE — Telephone Encounter (Signed)
NA

## 2019-07-15 ENCOUNTER — Ambulatory Visit: Payer: Medicare Other

## 2019-07-15 ENCOUNTER — Other Ambulatory Visit (INDEPENDENT_AMBULATORY_CARE_PROVIDER_SITE_OTHER): Payer: Medicare Other

## 2019-07-15 ENCOUNTER — Other Ambulatory Visit: Payer: Self-pay

## 2019-07-15 DIAGNOSIS — M810 Age-related osteoporosis without current pathological fracture: Secondary | ICD-10-CM | POA: Diagnosis not present

## 2019-07-18 LAB — BASIC METABOLIC PANEL
BUN: 23 mg/dL (ref 6–23)
CO2: 29 mEq/L (ref 19–32)
Calcium: 9.8 mg/dL (ref 8.4–10.5)
Chloride: 100 mEq/L (ref 96–112)
Creatinine, Ser: 0.84 mg/dL (ref 0.40–1.20)
GFR: 67.68 mL/min (ref >60.00)
Glucose, Bld: 82 mg/dL (ref 70–99)
Potassium: 4.1 mEq/L (ref 3.5–5.1)
Sodium: 137 mEq/L (ref 135–145)

## 2019-07-19 ENCOUNTER — Encounter: Payer: Self-pay | Admitting: Internal Medicine

## 2019-08-02 ENCOUNTER — Encounter: Payer: Medicare Other | Admitting: Nurse Practitioner

## 2019-08-03 DIAGNOSIS — D485 Neoplasm of uncertain behavior of skin: Secondary | ICD-10-CM | POA: Diagnosis not present

## 2019-08-03 DIAGNOSIS — L821 Other seborrheic keratosis: Secondary | ICD-10-CM | POA: Diagnosis not present

## 2019-08-03 DIAGNOSIS — D2239 Melanocytic nevi of other parts of face: Secondary | ICD-10-CM | POA: Diagnosis not present

## 2019-08-03 DIAGNOSIS — D1801 Hemangioma of skin and subcutaneous tissue: Secondary | ICD-10-CM | POA: Diagnosis not present

## 2019-08-03 DIAGNOSIS — D225 Melanocytic nevi of trunk: Secondary | ICD-10-CM | POA: Diagnosis not present

## 2019-08-12 ENCOUNTER — Ambulatory Visit: Payer: Medicare Other

## 2019-08-22 ENCOUNTER — Encounter: Payer: Medicare Other | Admitting: Nurse Practitioner

## 2019-09-01 ENCOUNTER — Encounter: Payer: Medicare Other | Admitting: Nurse Practitioner

## 2019-09-07 DIAGNOSIS — R110 Nausea: Secondary | ICD-10-CM | POA: Diagnosis not present

## 2019-09-07 DIAGNOSIS — R519 Headache, unspecified: Secondary | ICD-10-CM | POA: Diagnosis not present

## 2019-09-07 DIAGNOSIS — R0981 Nasal congestion: Secondary | ICD-10-CM | POA: Diagnosis not present

## 2019-09-07 DIAGNOSIS — I10 Essential (primary) hypertension: Secondary | ICD-10-CM | POA: Diagnosis not present

## 2019-09-20 ENCOUNTER — Encounter: Payer: Medicare Other | Admitting: Nurse Practitioner

## 2019-10-04 ENCOUNTER — Other Ambulatory Visit: Payer: Self-pay

## 2019-10-04 ENCOUNTER — Encounter: Payer: Self-pay | Admitting: Nurse Practitioner

## 2019-10-04 ENCOUNTER — Ambulatory Visit (INDEPENDENT_AMBULATORY_CARE_PROVIDER_SITE_OTHER): Payer: Medicare Other | Admitting: Nurse Practitioner

## 2019-10-04 VITALS — BP 118/78 | Ht 60.0 in | Wt 133.0 lb

## 2019-10-04 DIAGNOSIS — Z8542 Personal history of malignant neoplasm of other parts of uterus: Secondary | ICD-10-CM

## 2019-10-04 DIAGNOSIS — M810 Age-related osteoporosis without current pathological fracture: Secondary | ICD-10-CM | POA: Diagnosis not present

## 2019-10-04 DIAGNOSIS — Z90722 Acquired absence of ovaries, bilateral: Secondary | ICD-10-CM

## 2019-10-04 DIAGNOSIS — Z90710 Acquired absence of both cervix and uterus: Secondary | ICD-10-CM | POA: Diagnosis not present

## 2019-10-04 DIAGNOSIS — Z01419 Encounter for gynecological examination (general) (routine) without abnormal findings: Secondary | ICD-10-CM | POA: Diagnosis not present

## 2019-10-04 DIAGNOSIS — Z9079 Acquired absence of other genital organ(s): Secondary | ICD-10-CM

## 2019-10-04 NOTE — Patient Instructions (Addendum)
Schedule mammogram! Pr[place] [phone] [address] [place], [place] [address]   Osteoporosis  Osteoporosis happens when your bones get thin and weak. This can cause your bones to break (fracture) more easily. You can do things at home to make your bones stronger. Follow these instructions at home:  Activity  Exercise as told by your doctor. Ask your doctor what activities are safe for you. You should do: ? Exercises that make your muscles work to hold your body weight up (weight-bearing exercises). These include tai chi, yoga, and walking. ? Exercises to make your muscles stronger. One example is lifting weights. Lifestyle  Limit alcohol intake to no more than 1 drink a day for nonpregnant women and 2 drinks a day for men. One drink equals 12 oz of beer, 5 oz of wine, or 1 oz of hard liquor.  Do not use any products that have nicotine or tobacco in them. These include cigarettes and e-cigarettes. If you need help quitting, ask your doctor. Preventing falls  Use tools to help you move around (mobility aids) as needed. These include canes, walkers, scooters, and crutches.  Keep rooms well-lit and free of clutter.  Put away things that could make you trip. These include cords and rugs.  Install safety rails on stairs. Install grab bars in bathrooms.  Use rubber mats in slippery areas, like bathrooms.  Wear shoes that: ? Fit you well. ? Support your feet. ? Have closed toes. ? Have rubber soles or low heels.  Tell your doctor about all of the medicines you are taking. Some medicines can make you more likely to fall. General instructions  Eat plenty of calcium and vitamin D. These nutrients are good for your bones. Good sources of calcium and vitamin D include: ? Some fatty fish, such as salmon and tuna. ? Foods that have calcium and vitamin D added to them (fortified foods). For example, some breakfast cereals are fortified with calcium  and vitamin D. ? Egg yolks. ? Cheese. ? Liver.  Take over-the-counter and prescription medicines only as told by your doctor.  Keep all follow-up visits as told by your doctor. This is important. Contact a doctor if:  You have not been tested (screened) for osteoporosis and you are: ? A woman who is age 42 or older. ? A man who is age 57 or older. Get help right away if:  You fall.  You get hurt. Summary  Osteoporosis happens when your bones get thin and weak.  Weak bones can break (fracture) more easily.  Eat plenty of calcium and vitamin D. These nutrients are good for your bones.  Tell your doctor about all of the medicines that you take. This information is not intended to replace advice given to you by your health care provider. Make sure you discuss any questions you have with your health care provider. Document Revised: 01/02/2017 Document Reviewed: 11/14/2016 Elsevier Patient Education  2020 [place] Maintenance After Age 46 After age 49, you are at a higher risk for certain long-term diseases and infections as well as injuries from falls. Falls are a major cause of broken bones and head injuries in people who are older than age 18. Getting regular preventive care can help to keep you healthy and well. Preventive care includes getting regular testing and making lifestyle changes as recommended by your health care provider. Talk with your health care provider about:  Which screenings and tests you should have.  A screening is a test that checks for a disease when you have no symptoms.  A diet and exercise plan that is right for you. What should I know about screenings and tests to prevent falls? Screening and testing are the best ways to find a health problem early. Early diagnosis and treatment give you the best chance of managing medical conditions that are common after age 100. Certain conditions and lifestyle choices may make you more likely to have a fall.  Your health care provider may recommend:  Regular vision checks. Poor vision and conditions such as cataracts can make you more likely to have a fall. If you wear glasses, make sure to get your prescription updated if your vision changes.  Medicine review. Work with your health care provider to regularly review all of the medicines you are taking, including over-the-counter medicines. Ask your health care provider about any side effects that may make you more likely to have a fall. Tell your health care provider if any medicines that you take make you feel dizzy or sleepy.  Osteoporosis screening. Osteoporosis is a condition that causes the bones to get weaker. This can make the bones weak and cause them to break more easily.  Blood pressure screening. Blood pressure changes and medicines to control blood pressure can make you feel dizzy.  Strength and balance checks. Your health care provider may recommend certain tests to check your strength and balance while standing, walking, or changing positions.  Foot health exam. Foot pain and numbness, as well as not wearing proper footwear, can make you more likely to have a fall.  Depression screening. You may be more likely to have a fall if you have a fear of falling, feel emotionally low, or feel unable to do activities that you used to do.  Alcohol use screening. Using too much alcohol can affect your balance and may make you more likely to have a fall. What actions can I take to lower my risk of falls? General instructions  Talk with your health care provider about your risks for falling. Tell your health care provider if: ? You fall. Be sure to tell your health care provider about all falls, even ones that seem minor. ? You feel dizzy, sleepy, or off-balance.  Take over-the-counter and prescription medicines only as told by your health care provider. These include any supplements.  Eat a healthy diet and maintain a healthy weight. A healthy  diet includes low-fat dairy products, low-fat (lean) meats, and fiber from whole grains, beans, and lots of fruits and vegetables. Home safety  Remove any tripping hazards, such as rugs, cords, and clutter.  Install safety equipment such as grab bars in bathrooms and safety rails on stairs.  Keep rooms and walkways well-lit. Activity   Follow a regular exercise program to stay fit. This will help you maintain your balance. Ask your health care provider what types of exercise are appropriate for you.  If you need a cane or walker, use it as recommended by your health care provider.  Wear supportive shoes that have nonskid soles. Lifestyle  Do not drink alcohol if your health care provider tells you not to drink.  If you drink alcohol, limit how much you have: ? 0-1 drink a day for women. ? 0-2 drinks a day for men.  Be aware of how much alcohol is in your drink. In the [place] one drink equals one typical bottle of beer (12 oz), one-half glass of wine (5 oz),  or one shot of hard liquor (1 oz).  Do not use any products that contain nicotine or tobacco, such as cigarettes and e-cigarettes. If you need help quitting, ask your health care provider. Summary  Having a healthy lifestyle and getting preventive care can help to protect your health and wellness after age 24.  Screening and testing are the best way to find a health problem early and help you avoid having a fall. Early diagnosis and treatment give you the best chance for managing medical conditions that are more common for people who are older than age 12.  Falls are a major cause of broken bones and head injuries in people who are older than age 51. Take precautions to prevent a fall at home.  Work with your health care provider to learn what changes you can make to improve your health and wellness and to prevent falls. This information is not intended to replace advice given to you by your health care provider. Make sure you  discuss any questions you have with your health care provider. Document Revised: 05/13/2018 Document Reviewed: 12/03/2016 Elsevier Patient Education  2020 [employer].

## 2019-10-04 NOTE — Progress Notes (Signed)
   Janice [name] [birth year] [id number]   History:  69 y.o. G1P0010 presents for breast and pelvic exam without GYN complaints. 2005 TAH BSO for endometrial cancer stage 1. Osteoporosis with vertebral fracture 3 years ago, referred to endocrinology for management but has not followed up with them. Was on Prolia in the past with decrease in bone density.  Gynecologic History No LMP recorded. Patient has had a hysterectomy.   Last Pap: 08/17/2017. Results were: normal Last mammogram: 06/28/2015. Results were: normal Last colonoscopy: 08/29/2014. Results were: normal Last Dexa: 12/08/2018. Results were: -2.9 left hip, 2 year follow up recommended  Past medical history, past surgical history, family history and social history were all reviewed and documented in the EPIC chart.  ROS:  A ROS was performed and pertinent positives and negatives are included.  Exam:  Vitals:   10/04/19 1415  BP: 118/78  Weight: 133 lb (60.3 kg)  Height: 5' (1.524 m)   Body mass index is 25.97 kg/m.  General appearance:  Normal Thyroid:  Symmetrical, normal in size, without palpable masses or nodularity. Respiratory  Auscultation:  Clear without wheezing or rhonchi Cardiovascular  Auscultation:  Regular rate, without rubs, murmurs or gallops  Edema/varicosities:  Not grossly evident Abdominal  Soft,nontender, without masses, guarding or rebound.  Liver/spleen:  No organomegaly noted  Hernia:  None appreciated  Skin  Inspection:  Grossly normal   Breasts: Examined lying and sitting.   Right: Without masses, retractions, discharge or axillary adenopathy.   Left: Without masses, retractions, discharge or axillary adenopathy. Gentitourinary   Inguinal/mons:  Normal without inguinal adenopathy  External genitalia:  Normal  BUS/Urethra/Skene's glands:  Normal  Vagina:  Atrophic changes  Cervix:  Absent  Uterus: Absent  Adnexa/parametria:     Rt: Without masses or tenderness.   Lt: Without masses or  tenderness.  Anus and perineum: Normal  Digital rectal exam: Normal sphincter tone without palpated masses or tenderness  Assessment/Plan:  69 y.o. G1P0010 for breast and pelvic exam.   Well female exam with routine gynecological exam - Education provided on SBEs, importance of preventative screenings, current guidelines, high calcium diet, regular exercise, and multivitamin daily. Pap 2019 normal. Normal pap history. Will repeat next year at 3-year interval due to history of endometrial cancer.   Age-related osteoporosis without current pathological fracture - t-score -2.9 of left hip with vertebral fracture 3 years ago. Referral sent to endocrinology for treatment but she has not followed up with them. She was on Prolia in the past but had a decease in bone density. She plans to reschedule with them soon. Taking daily Vitamin D and calcium supplement and trying to stay active but it is difficult with arthritic pain. Discussed home safety and fall prevention due to increased risk for fracture.   History of endometrial cancer - 2005 stage 1, grade 1  History of total abdominal hysterectomy and bilateral salpingo-oophorectomy - 2005 for endometrial cancer. No HRT.   Screening for breast cancer - overdue for mammogram with most recent in 2017. Discussed current guidelines and importance of preventative screenings. [facility] on [facility] and she promises to schedule this soon.  Follow up in 1 year for annual       [place], 2:31 PM 10/04/2019

## 2019-10-27 DIAGNOSIS — E039 Hypothyroidism, unspecified: Secondary | ICD-10-CM | POA: Diagnosis not present

## 2019-10-27 DIAGNOSIS — E785 Hyperlipidemia, unspecified: Secondary | ICD-10-CM | POA: Diagnosis not present

## 2019-10-27 DIAGNOSIS — I10 Essential (primary) hypertension: Secondary | ICD-10-CM | POA: Diagnosis not present

## 2020-01-04 DIAGNOSIS — J209 Acute bronchitis, unspecified: Secondary | ICD-10-CM | POA: Diagnosis not present

## 2020-01-04 DIAGNOSIS — R051 Acute cough: Secondary | ICD-10-CM | POA: Diagnosis not present

## 2020-01-04 DIAGNOSIS — R062 Wheezing: Secondary | ICD-10-CM | POA: Diagnosis not present

## 2020-01-04 DIAGNOSIS — R0602 Shortness of breath: Secondary | ICD-10-CM | POA: Diagnosis not present

## 2020-01-26 DIAGNOSIS — E785 Hyperlipidemia, unspecified: Secondary | ICD-10-CM | POA: Diagnosis not present

## 2020-01-26 DIAGNOSIS — E039 Hypothyroidism, unspecified: Secondary | ICD-10-CM | POA: Diagnosis not present

## 2020-01-26 DIAGNOSIS — Z79899 Other long term (current) drug therapy: Secondary | ICD-10-CM | POA: Diagnosis not present

## 2020-01-26 DIAGNOSIS — I10 Essential (primary) hypertension: Secondary | ICD-10-CM | POA: Diagnosis not present

## 2020-03-01 ENCOUNTER — Telehealth: Payer: Self-pay | Admitting: Internal Medicine

## 2020-03-01 NOTE — Telephone Encounter (Signed)
Patient requests to be called at ph# [phone] re: Rescheduling her injection for her bones that was previously cancelled by our office

## 2020-03-02 NOTE — Telephone Encounter (Signed)
Could you help patient regarding her Prolia injection?

## 2020-03-03 NOTE — Telephone Encounter (Signed)
I do not see that this patient was ordered Prolia I believe this is for Reclast and I do not handle that-if this is for Reclast it would need to go to [facility]-

## 2020-03-05 ENCOUNTER — Ambulatory Visit: Payer: Medicare Other | Admitting: Internal Medicine

## 2020-03-05 NOTE — Telephone Encounter (Signed)
Vaughan Basta, please advised on reclast ?    Thanks

## 2020-03-05 NOTE — Telephone Encounter (Signed)
Please advise 

## 2020-03-06 NOTE — Telephone Encounter (Signed)
Called.  No answer.  Message left that patient is in need of reclast infusion,  Instructed to call to discuss this treatment and to schedule this.  Telephone number given.

## 2020-03-14 ENCOUNTER — Telehealth: Payer: Self-pay | Admitting: Nutrition

## 2020-03-15 NOTE — Telephone Encounter (Signed)
Please schedule the pt for a follow up with me. She has not been seen in a year.   Thanks

## 2020-03-15 NOTE — Telephone Encounter (Signed)
LVM for her to give [place] a call back and schedule an appointment.

## 2020-03-27 DIAGNOSIS — M791 Myalgia, unspecified site: Secondary | ICD-10-CM | POA: Diagnosis not present

## 2020-08-22 DIAGNOSIS — E039 Hypothyroidism, unspecified: Secondary | ICD-10-CM | POA: Diagnosis not present

## 2020-08-22 DIAGNOSIS — E785 Hyperlipidemia, unspecified: Secondary | ICD-10-CM | POA: Diagnosis not present

## 2020-08-22 DIAGNOSIS — G4700 Insomnia, unspecified: Secondary | ICD-10-CM | POA: Diagnosis not present

## 2020-08-22 DIAGNOSIS — Z0000 Encounter for general adult medical examination without abnormal findings: Secondary | ICD-10-CM | POA: Diagnosis not present

## 2020-08-22 DIAGNOSIS — I10 Essential (primary) hypertension: Secondary | ICD-10-CM | POA: Diagnosis not present

## 2020-08-22 DIAGNOSIS — M159 Polyosteoarthritis, unspecified: Secondary | ICD-10-CM | POA: Diagnosis not present

## 2020-08-22 DIAGNOSIS — Z7189 Other specified counseling: Secondary | ICD-10-CM | POA: Diagnosis not present

## 2020-08-29 DIAGNOSIS — E538 Deficiency of other specified B group vitamins: Secondary | ICD-10-CM | POA: Diagnosis not present

## 2020-08-29 DIAGNOSIS — E785 Hyperlipidemia, unspecified: Secondary | ICD-10-CM | POA: Diagnosis not present

## 2020-08-29 DIAGNOSIS — E039 Hypothyroidism, unspecified: Secondary | ICD-10-CM | POA: Diagnosis not present

## 2020-08-29 DIAGNOSIS — Z79899 Other long term (current) drug therapy: Secondary | ICD-10-CM | POA: Diagnosis not present

## 2020-10-26 DIAGNOSIS — I739 Peripheral vascular disease, unspecified: Secondary | ICD-10-CM | POA: Diagnosis not present

## 2020-12-10 DIAGNOSIS — J0100 Acute maxillary sinusitis, unspecified: Secondary | ICD-10-CM | POA: Diagnosis not present

## 2020-12-10 DIAGNOSIS — R0981 Nasal congestion: Secondary | ICD-10-CM | POA: Diagnosis not present

## 2021-01-08 DIAGNOSIS — J0111 Acute recurrent frontal sinusitis: Secondary | ICD-10-CM | POA: Diagnosis not present

## 2021-02-20 ENCOUNTER — Ambulatory Visit (INDEPENDENT_AMBULATORY_CARE_PROVIDER_SITE_OTHER): Payer: Medicare Other | Admitting: Nurse Practitioner

## 2021-02-20 ENCOUNTER — Other Ambulatory Visit: Payer: Self-pay

## 2021-02-20 ENCOUNTER — Encounter: Payer: Self-pay | Admitting: Nurse Practitioner

## 2021-02-20 VITALS — BP 134/70 | Ht <= 58 in | Wt 125.0 lb

## 2021-02-20 DIAGNOSIS — Z01419 Encounter for gynecological examination (general) (routine) without abnormal findings: Secondary | ICD-10-CM | POA: Diagnosis not present

## 2021-02-20 DIAGNOSIS — Z780 Asymptomatic menopausal state: Secondary | ICD-10-CM

## 2021-02-20 DIAGNOSIS — Z9189 Other specified personal risk factors, not elsewhere classified: Secondary | ICD-10-CM

## 2021-02-20 DIAGNOSIS — M810 Age-related osteoporosis without current pathological fracture: Secondary | ICD-10-CM

## 2021-02-20 NOTE — Progress Notes (Signed)
° °  Janice [name] 17-Sep-1950 [id number]   History:  71 y.o. G1P0010 presents for breast and pelvic exam without GYN complaints. Postmenopausal - no HRT. S/P 2005 [facility] BSO for endometrial cancer stage 1. Osteoporosis with vertebral fracture 4 years ago. Has seen endocrinology in the past for management, was on Prolia with decrease in bone density. She plans to schedule an appointment with them.   Gynecologic History No LMP recorded. Patient has had a hysterectomy.    Health maintenance Last Pap: 08/17/2017. Results were: Normal Last mammogram: 06/28/2015. Results were: Normal Last colonoscopy: 08/29/2014. Results were: Normal, 10-year recall Last Dexa: 12/08/2018. Results were: -2.9 left hip, 2-year follow up recommended  Past medical history, past surgical history, family history and social history were all reviewed and documented in the EPIC chart.  ROS:  A ROS was performed and pertinent positives and negatives are included.  Exam:  Vitals:   02/20/21 1443  BP: 134/70  Weight: 125 lb (56.7 kg)  Height: 4\' 10"  (1.473 m)    Body mass index is 26.13 kg/m.  General appearance:  Normal Thyroid:  Symmetrical, normal in size, without palpable masses or nodularity. Respiratory  Auscultation:  Clear without wheezing or rhonchi Cardiovascular  Auscultation:  Regular rate, without rubs, murmurs or gallops  Edema/varicosities:  Not grossly evident Abdominal  Soft,nontender, without masses, guarding or rebound.  Liver/spleen:  No organomegaly noted  Hernia:  None appreciated  Skin  Inspection:  Grossly normal   Breasts: Examined lying and sitting.   Right: Without masses, retractions, discharge or axillary adenopathy.   Left: Without masses, retractions, discharge or axillary adenopathy. Gentitourinary   Inguinal/mons:  Normal without inguinal adenopathy  External genitalia:  Normal  BUS/Urethra/Skene's glands:  Normal  Vagina:  Atrophic changes  Cervix:  Absent  Uterus:  Absent  Adnexa/parametria:     Rt: Without masses or tenderness.   Lt: Without masses or tenderness.  Anus and perineum: Normal  Digital rectal exam: Normal sphincter tone without palpated masses or tenderness  Assessment/Plan:  71 y.o. G1P0010 for breast and pelvic exam.   Well female exam with routine gynecological exam - Education provided on SBEs, importance of preventative screenings, current guidelines, high calcium diet, regular exercise, and multivitamin daily. Labs with PCP.   Postmenopausal - no HRT. S/P [facility] BSO for endometrial cancer stage 1 in 2005  Age-related osteoporosis without current pathological fracture - T-score -2.9 of left hip 12/2018 with vertebral fracture 4 years ago. Managed by endocrinology, was on Prolia in the past but had a decease in bone density. She plans to reschedule with them now. Provided her with their contact information. Taking daily Vitamin D and calcium supplement and trying to stay active but it is difficult with arthritic pain. Discussed home safety and fall prevention due to increased risk for fracture. Will schedule DXA now.   Screening for cervical cancer - Normal Pap history. No longer screening per guidelines.   Screening for breast cancer - Normal mammogram history. Overdue. Discussed current guidelines and importance of preventative screenings.  Normal breast exam today.  Screening for colon cancer - 2016 colonoscopy. Will repeat at 10-year interval per GI's recommendation.   Follow up in 1 year for annual.      Tamela Gammon [facility], 3:21 PM 02/20/2021

## 2021-02-21 DIAGNOSIS — Z79899 Other long term (current) drug therapy: Secondary | ICD-10-CM | POA: Diagnosis not present

## 2021-02-21 DIAGNOSIS — E785 Hyperlipidemia, unspecified: Secondary | ICD-10-CM | POA: Diagnosis not present

## 2021-02-21 DIAGNOSIS — E039 Hypothyroidism, unspecified: Secondary | ICD-10-CM | POA: Diagnosis not present

## 2021-02-21 DIAGNOSIS — E538 Deficiency of other specified B group vitamins: Secondary | ICD-10-CM | POA: Diagnosis not present

## 2021-02-21 DIAGNOSIS — I739 Peripheral vascular disease, unspecified: Secondary | ICD-10-CM | POA: Diagnosis not present

## 2021-02-21 DIAGNOSIS — I10 Essential (primary) hypertension: Secondary | ICD-10-CM | POA: Diagnosis not present

## 2021-05-13 DIAGNOSIS — E039 Hypothyroidism, unspecified: Secondary | ICD-10-CM | POA: Diagnosis not present

## 2021-08-16 ENCOUNTER — Other Ambulatory Visit: Payer: Self-pay | Admitting: Internal Medicine

## 2021-08-16 DIAGNOSIS — Z139 Encounter for screening, unspecified: Secondary | ICD-10-CM

## 2021-08-21 ENCOUNTER — Ambulatory Visit
Admission: RE | Admit: 2021-08-21 | Discharge: 2021-08-21 | Disposition: A | Discharge status: Home / Self Care | Payer: Medicare Other | Source: Ambulatory Visit | Attending: Internal Medicine | Admitting: Internal Medicine

## 2021-08-21 DIAGNOSIS — Z139 Encounter for screening, unspecified: Secondary | ICD-10-CM

## 2021-08-21 DIAGNOSIS — Z1231 Encounter for screening mammogram for malignant neoplasm of breast: Secondary | ICD-10-CM | POA: Diagnosis not present

## 2021-11-20 DIAGNOSIS — E538 Deficiency of other specified B group vitamins: Secondary | ICD-10-CM | POA: Diagnosis not present

## 2021-11-20 DIAGNOSIS — E039 Hypothyroidism, unspecified: Secondary | ICD-10-CM | POA: Diagnosis not present

## 2021-11-20 DIAGNOSIS — Z7189 Other specified counseling: Secondary | ICD-10-CM | POA: Diagnosis not present

## 2021-11-20 DIAGNOSIS — R5383 Other fatigue: Secondary | ICD-10-CM | POA: Diagnosis not present

## 2021-11-20 DIAGNOSIS — I10 Essential (primary) hypertension: Secondary | ICD-10-CM | POA: Diagnosis not present

## 2021-11-20 DIAGNOSIS — E785 Hyperlipidemia, unspecified: Secondary | ICD-10-CM | POA: Diagnosis not present

## 2021-11-20 DIAGNOSIS — M810 Age-related osteoporosis without current pathological fracture: Secondary | ICD-10-CM | POA: Diagnosis not present

## 2021-11-20 DIAGNOSIS — Z0000 Encounter for general adult medical examination without abnormal findings: Secondary | ICD-10-CM | POA: Diagnosis not present

## 2021-11-20 DIAGNOSIS — Z79899 Other long term (current) drug therapy: Secondary | ICD-10-CM | POA: Diagnosis not present

## 2022-01-24 DIAGNOSIS — B9789 Other viral agents as the cause of diseases classified elsewhere: Secondary | ICD-10-CM | POA: Diagnosis not present

## 2022-01-24 DIAGNOSIS — J019 Acute sinusitis, unspecified: Secondary | ICD-10-CM | POA: Diagnosis not present

## 2022-05-21 DIAGNOSIS — E039 Hypothyroidism, unspecified: Secondary | ICD-10-CM | POA: Diagnosis not present

## 2022-05-21 DIAGNOSIS — Z79899 Other long term (current) drug therapy: Secondary | ICD-10-CM | POA: Diagnosis not present

## 2022-05-21 DIAGNOSIS — I10 Essential (primary) hypertension: Secondary | ICD-10-CM | POA: Diagnosis not present

## 2022-05-21 DIAGNOSIS — E785 Hyperlipidemia, unspecified: Secondary | ICD-10-CM | POA: Diagnosis not present

## 2022-05-21 DIAGNOSIS — R5383 Other fatigue: Secondary | ICD-10-CM | POA: Diagnosis not present

## 2022-06-26 DIAGNOSIS — D649 Anemia, unspecified: Secondary | ICD-10-CM | POA: Diagnosis not present

## 2022-07-02 DIAGNOSIS — D649 Anemia, unspecified: Secondary | ICD-10-CM | POA: Diagnosis not present

## 2022-07-02 DIAGNOSIS — D72819 Decreased white blood cell count, unspecified: Secondary | ICD-10-CM | POA: Diagnosis not present

## 2022-09-17 DIAGNOSIS — E785 Hyperlipidemia, unspecified: Secondary | ICD-10-CM | POA: Diagnosis not present

## 2022-09-17 DIAGNOSIS — D509 Iron deficiency anemia, unspecified: Secondary | ICD-10-CM | POA: Diagnosis not present

## 2022-09-17 DIAGNOSIS — Z79899 Other long term (current) drug therapy: Secondary | ICD-10-CM | POA: Diagnosis not present

## 2022-09-23 DIAGNOSIS — E039 Hypothyroidism, unspecified: Secondary | ICD-10-CM | POA: Diagnosis not present

## 2022-09-23 DIAGNOSIS — I10 Essential (primary) hypertension: Secondary | ICD-10-CM | POA: Diagnosis not present

## 2022-09-23 DIAGNOSIS — E785 Hyperlipidemia, unspecified: Secondary | ICD-10-CM | POA: Diagnosis not present

## 2022-09-23 DIAGNOSIS — R5383 Other fatigue: Secondary | ICD-10-CM | POA: Diagnosis not present

## 2022-09-29 DIAGNOSIS — L255 Unspecified contact dermatitis due to plants, except food: Secondary | ICD-10-CM | POA: Diagnosis not present

## 2022-11-26 DIAGNOSIS — R5383 Other fatigue: Secondary | ICD-10-CM | POA: Diagnosis not present

## 2022-11-26 DIAGNOSIS — Z7189 Other specified counseling: Secondary | ICD-10-CM | POA: Diagnosis not present

## 2022-11-26 DIAGNOSIS — Z6824 Body mass index (BMI) 24.0-24.9, adult: Secondary | ICD-10-CM | POA: Diagnosis not present

## 2022-11-26 DIAGNOSIS — G4700 Insomnia, unspecified: Secondary | ICD-10-CM | POA: Diagnosis not present

## 2022-11-26 DIAGNOSIS — E039 Hypothyroidism, unspecified: Secondary | ICD-10-CM | POA: Diagnosis not present

## 2022-11-26 DIAGNOSIS — I10 Essential (primary) hypertension: Secondary | ICD-10-CM | POA: Diagnosis not present

## 2022-11-26 DIAGNOSIS — E785 Hyperlipidemia, unspecified: Secondary | ICD-10-CM | POA: Diagnosis not present

## 2022-11-26 DIAGNOSIS — Z0000 Encounter for general adult medical examination without abnormal findings: Secondary | ICD-10-CM | POA: Diagnosis not present

## 2023-03-02 DIAGNOSIS — Z6824 Body mass index (BMI) 24.0-24.9, adult: Secondary | ICD-10-CM | POA: Diagnosis not present

## 2023-03-02 DIAGNOSIS — E039 Hypothyroidism, unspecified: Secondary | ICD-10-CM | POA: Diagnosis not present

## 2023-03-02 DIAGNOSIS — I10 Essential (primary) hypertension: Secondary | ICD-10-CM | POA: Diagnosis not present

## 2023-03-02 DIAGNOSIS — E785 Hyperlipidemia, unspecified: Secondary | ICD-10-CM | POA: Diagnosis not present

## 2023-04-15 DIAGNOSIS — R42 Dizziness and giddiness: Secondary | ICD-10-CM | POA: Diagnosis not present

## 2023-04-15 DIAGNOSIS — Z8249 Family history of ischemic heart disease and other diseases of the circulatory system: Secondary | ICD-10-CM | POA: Diagnosis not present

## 2023-04-15 DIAGNOSIS — F330 Major depressive disorder, recurrent, mild: Secondary | ICD-10-CM | POA: Diagnosis not present

## 2023-04-15 DIAGNOSIS — Z6824 Body mass index (BMI) 24.0-24.9, adult: Secondary | ICD-10-CM | POA: Diagnosis not present

## 2023-04-15 DIAGNOSIS — E039 Hypothyroidism, unspecified: Secondary | ICD-10-CM | POA: Diagnosis not present

## 2023-04-15 DIAGNOSIS — Z79899 Other long term (current) drug therapy: Secondary | ICD-10-CM | POA: Diagnosis not present

## 2023-04-15 DIAGNOSIS — E785 Hyperlipidemia, unspecified: Secondary | ICD-10-CM | POA: Diagnosis not present

## 2023-04-15 DIAGNOSIS — D509 Iron deficiency anemia, unspecified: Secondary | ICD-10-CM | POA: Diagnosis not present

## 2023-04-15 DIAGNOSIS — I10 Essential (primary) hypertension: Secondary | ICD-10-CM | POA: Diagnosis not present

## 2023-04-15 DIAGNOSIS — G4700 Insomnia, unspecified: Secondary | ICD-10-CM | POA: Diagnosis not present

## 2023-06-01 DIAGNOSIS — Z6824 Body mass index (BMI) 24.0-24.9, adult: Secondary | ICD-10-CM | POA: Diagnosis not present

## 2023-06-01 DIAGNOSIS — M1711 Unilateral primary osteoarthritis, right knee: Secondary | ICD-10-CM | POA: Diagnosis not present

## 2023-07-21 DIAGNOSIS — J40 Bronchitis, not specified as acute or chronic: Secondary | ICD-10-CM | POA: Diagnosis not present

## 2023-07-21 DIAGNOSIS — R110 Nausea: Secondary | ICD-10-CM | POA: Diagnosis not present

## 2023-07-21 DIAGNOSIS — Z6824 Body mass index (BMI) 24.0-24.9, adult: Secondary | ICD-10-CM | POA: Diagnosis not present

## 2023-07-21 DIAGNOSIS — R062 Wheezing: Secondary | ICD-10-CM | POA: Diagnosis not present

## 2023-07-21 DIAGNOSIS — J329 Chronic sinusitis, unspecified: Secondary | ICD-10-CM | POA: Diagnosis not present

## 2024-02-18 ENCOUNTER — Other Ambulatory Visit (HOSPITAL_BASED_OUTPATIENT_CLINIC_OR_DEPARTMENT_OTHER): Payer: Self-pay | Admitting: Internal Medicine

## 2024-02-18 DIAGNOSIS — Z1231 Encounter for screening mammogram for malignant neoplasm of breast: Secondary | ICD-10-CM

## 2024-02-19 ENCOUNTER — Ambulatory Visit (HOSPITAL_BASED_OUTPATIENT_CLINIC_OR_DEPARTMENT_OTHER)
Admission: RE | Admit: 2024-02-19 | Discharge: 2024-02-19 | Disposition: A | Discharge status: Home / Self Care | Source: Ambulatory Visit | Attending: Internal Medicine | Admitting: Internal Medicine

## 2024-02-19 DIAGNOSIS — Z1231 Encounter for screening mammogram for malignant neoplasm of breast: Secondary | ICD-10-CM
